# Patient Record
Sex: Male | Born: 1960 | Race: White | Hispanic: No | Marital: Married | Smoking: Former smoker
Health system: Southern US, Community
[De-identification: ages and names within clinical notes are randomized; demographics above are authoritative.]

## PROBLEM LIST (undated history)

## (undated) DIAGNOSIS — K469 Unspecified abdominal hernia without obstruction or gangrene: Secondary | ICD-10-CM

## (undated) DIAGNOSIS — N329 Bladder disorder, unspecified: Secondary | ICD-10-CM

## (undated) DIAGNOSIS — I10 Essential (primary) hypertension: Secondary | ICD-10-CM

## (undated) DIAGNOSIS — Z8719 Personal history of other diseases of the digestive system: Secondary | ICD-10-CM

## (undated) DIAGNOSIS — Z8669 Personal history of other diseases of the nervous system and sense organs: Secondary | ICD-10-CM

## (undated) HISTORY — PX: TONSILLECTOMY AND ADENOIDECTOMY: SUR1326

---

## 2014-04-28 DIAGNOSIS — Z8669 Personal history of other diseases of the nervous system and sense organs: Secondary | ICD-10-CM

## 2014-04-28 HISTORY — DX: Personal history of other diseases of the nervous system and sense organs: Z86.69

## 2017-04-15 ENCOUNTER — Other Ambulatory Visit: Payer: Self-pay | Admitting: Urology

## 2017-04-23 ENCOUNTER — Encounter (HOSPITAL_BASED_OUTPATIENT_CLINIC_OR_DEPARTMENT_OTHER): Payer: Self-pay | Admitting: *Deleted

## 2017-04-23 NOTE — Progress Notes (Signed)
NPO AFTER MN.  ARRIVE AT 0715.  NEEDS HG. 

## 2017-04-29 NOTE — H&P (Signed)
CC: I have blood in my urine.  HPI: Oscar Church is a 56 year-old male established patient who is here for blood in the urine.  He did see the blood in his urine. He first noticed the symptoms 03/22/2017. He has not seen blood clots.   He does not have a burning sensation when he urinates. He is not currently having trouble urinating.   He is not having pain.   Oscar Church returns today for cystoscopy to complete his hematuria w/u. He had a CT that showed prostate calcificaitons but no other abnormalities. He is for cystoscopy.   GU history: Oscar Church is a 56 yo WM who is sent from Gross hematuria that he noted on a first morning void on 5/25. He had just one episode and had no pain or dysuria. He had some light headedness and sweats on the way to work that day. He went to urgent care and had microhematuria. He has no history of stones. He may have had a UTI remotely. He has had no GU surgery. About 2 months ago he had a single episode of urgency. He has nocturia x 1. He is a smoker.     ALLERGIES: No Allergies    MEDICATIONS: No Medications    GU PSH: Locm 300-399Mg /Ml Iodine,1Ml - 03/28/2017    NON-GU PSH: No Non-GU PSH    GU PMH: Gross hematuria, He had single episode of gross hematuria with a negative CT. He will be set up to return for cystoscopy. - 03/28/2017    NON-GU PMH: Hypercholesterolemia    FAMILY HISTORY: 2 daughters - Other 1 son - Other nephrolithiasis - No Family History   SOCIAL HISTORY: Marital Status: Married Current Smoking Status: Patient smokes.   Tobacco Use Assessment Completed: Used Tobacco in last 30 days? Drinks 2 caffeinated drinks per day.    REVIEW OF SYSTEMS:    GU Review Male:   Patient denies frequent urination, hard to postpone urination, burning/ pain with urination, get up at night to urinate, leakage of urine, stream starts and stops, trouble starting your stream, have to strain to urinate , erection problems, and penile pain.   Gastrointestinal (Upper):   Patient denies nausea, vomiting, and indigestion/ heartburn.  Gastrointestinal (Lower):   Patient denies diarrhea and constipation.  Constitutional:   Patient denies fever, night sweats, weight loss, and fatigue.  Skin:   Patient denies skin rash/ lesion and itching.  Eyes:   Patient denies double vision and blurred vision.  Ears/ Nose/ Throat:   Patient denies sore throat and sinus problems.  Hematologic/Lymphatic:   Patient denies swollen glands and easy bruising.  Cardiovascular:   Patient denies leg swelling and chest pains.  Respiratory:   Patient denies cough and shortness of breath.  Endocrine:   Patient denies excessive thirst.  Musculoskeletal:   Patient denies back pain and joint pain.  Neurological:   Patient denies headaches and dizziness.  Psychologic:   Patient denies depression and anxiety.   VITAL SIGNS: None   PAST DATA REVIEWED:  Source Of History:  Patient   PROCEDURES:         Flexible Cystoscopy - 52000  Risks, benefits, and some of the potential complications of the procedure were discussed. 91ml of 2% lidocaine jelly was instilled intraurethrally. Cipro 500mg  given for antibiotic prophylaxis.     Meatus:  Normal size. Normal location. Normal condition.  Urethra:  No strictures.  External Sphincter:  Normal.  Verumontanum:  Normal.  Prostate:  Obstructing. Moderate  hyperplasia.  Bladder Neck:  Non-obstructing.  Ureteral Orifices:  Normal location. Normal size. Normal shape. Effluxed clear urine.  Bladder:  Mild trabeculation. Erythematous mucosa about 2cm above the right trigone and worrisome for CIS. No tumors. No stones.      The procedure was well tolerated and there were no complications.         Urinalysis w/Scope Dipstick Dipstick Cont'd Micro  Color: Yellow Bilirubin: Neg WBC/hpf: 0 - 5/hpf  Appearance: Clear Ketones: Neg RBC/hpf: NS (Not Seen)  Specific Gravity: 1.025 Blood: 1+ Bacteria: Rare (0-9/hpf)  pH: <=5.0  Protein: Neg Cystals: NS (Not Seen)  Glucose: Neg Urobilinogen: 0.2 Casts: NS (Not Seen)    Nitrites: Neg Trichomonas: Not Present    Leukocyte Esterase: Neg Mucous: Not Present      Epithelial Cells: 0 - 5/hpf      Yeast: NS (Not Seen)      Sperm: Not Present    ASSESSMENT:      ICD-10 Details  1 GU:   Gross hematuria - R31.0   2   Bladder tumor/neoplasm - D41.4 He has a bladder lesion on the right bladder base that is worrisome for CIS. I will get a cytology today and get him set up for a cystoscopy with biopsy and fulguration. Risks of bleeding, infection, bladder injury, thrombotic events and anesthetic complications reviewed.      PLAN:           Orders Labs Urine Cytology          Schedule Return Visit/Planned Activity: ASAP - Schedule Surgery

## 2017-04-30 ENCOUNTER — Encounter (HOSPITAL_BASED_OUTPATIENT_CLINIC_OR_DEPARTMENT_OTHER): Payer: Self-pay | Admitting: *Deleted

## 2017-04-30 ENCOUNTER — Ambulatory Visit (HOSPITAL_BASED_OUTPATIENT_CLINIC_OR_DEPARTMENT_OTHER)
Admission: RE | Admit: 2017-04-30 | Discharge: 2017-04-30 | Disposition: A | Discharge status: Home / Self Care | Payer: BLUE CROSS/BLUE SHIELD | Source: Ambulatory Visit | Attending: Urology | Admitting: Urology

## 2017-04-30 ENCOUNTER — Ambulatory Visit (HOSPITAL_BASED_OUTPATIENT_CLINIC_OR_DEPARTMENT_OTHER): Payer: BLUE CROSS/BLUE SHIELD | Admitting: Anesthesiology

## 2017-04-30 ENCOUNTER — Encounter (HOSPITAL_BASED_OUTPATIENT_CLINIC_OR_DEPARTMENT_OTHER)
Admission: RE | Disposition: A | Discharge status: Home / Self Care | Payer: Self-pay | Source: Ambulatory Visit | Attending: Urology

## 2017-04-30 DIAGNOSIS — Z87891 Personal history of nicotine dependence: Secondary | ICD-10-CM | POA: Insufficient documentation

## 2017-04-30 DIAGNOSIS — C672 Malignant neoplasm of lateral wall of bladder: Secondary | ICD-10-CM | POA: Diagnosis not present

## 2017-04-30 DIAGNOSIS — R310 Gross hematuria: Secondary | ICD-10-CM | POA: Diagnosis not present

## 2017-04-30 HISTORY — DX: Bladder disorder, unspecified: N32.9

## 2017-04-30 HISTORY — DX: Personal history of other diseases of the digestive system: Z87.19

## 2017-04-30 HISTORY — PX: CYSTOSCOPY WITH BIOPSY: SHX5122

## 2017-04-30 HISTORY — PX: CYSTOSCOPY WITH FULGERATION: SHX6638

## 2017-04-30 SURGERY — CYSTOSCOPY, WITH BLADDER FULGURATION
Anesthesia: General | Site: Bladder

## 2017-04-30 MED ORDER — LACTATED RINGERS IV SOLN
INTRAVENOUS | Status: DC
  Administered 2017-04-30 (×2): via INTRAVENOUS
  Filled 2017-04-30: qty 1000

## 2017-04-30 MED ORDER — DEXAMETHASONE SODIUM PHOSPHATE 10 MG/ML IJ SOLN
INTRAMUSCULAR | Status: AC
  Filled 2017-04-30: qty 1

## 2017-04-30 MED ORDER — PHENAZOPYRIDINE HCL 200 MG PO TABS: 200.0000 mg | ORAL_TABLET | Freq: Three times a day (TID) | ORAL | 0 refills | Status: DC | PRN

## 2017-04-30 MED ORDER — FENTANYL CITRATE (PF) 100 MCG/2ML IJ SOLN
INTRAMUSCULAR | Status: AC
  Filled 2017-04-30: qty 2

## 2017-04-30 MED ORDER — HYDROCODONE-ACETAMINOPHEN 5-325 MG PO TABS: 1.0000 | ORAL_TABLET | Freq: Four times a day (QID) | ORAL | 0 refills | Status: DC | PRN

## 2017-04-30 MED ORDER — PROPOFOL 10 MG/ML IV BOLUS
INTRAVENOUS | Status: AC
  Filled 2017-04-30: qty 40

## 2017-04-30 MED ORDER — SODIUM CHLORIDE 0.9% FLUSH
3.0000 mL | INTRAVENOUS | Status: DC | PRN
  Filled 2017-04-30: qty 3

## 2017-04-30 MED ORDER — STERILE WATER FOR IRRIGATION IR SOLN
Status: DC | PRN
  Administered 2017-04-30: 3000 mL

## 2017-04-30 MED ORDER — MORPHINE SULFATE (PF) 2 MG/ML IV SOLN
2.0000 mg | INTRAVENOUS | Status: DC | PRN
  Filled 2017-04-30: qty 1

## 2017-04-30 MED ORDER — KETOROLAC TROMETHAMINE 30 MG/ML IJ SOLN
INTRAMUSCULAR | Status: DC | PRN
  Administered 2017-04-30: 30 mg via INTRAVENOUS

## 2017-04-30 MED ORDER — ONDANSETRON HCL 4 MG/2ML IJ SOLN
INTRAMUSCULAR | Status: DC | PRN
  Administered 2017-04-30: 4 mg via INTRAVENOUS

## 2017-04-30 MED ORDER — LIDOCAINE 2% (20 MG/ML) 5 ML SYRINGE
INTRAMUSCULAR | Status: AC
  Filled 2017-04-30: qty 5

## 2017-04-30 MED ORDER — DEXAMETHASONE SODIUM PHOSPHATE 4 MG/ML IJ SOLN
INTRAMUSCULAR | Status: DC | PRN
  Administered 2017-04-30: 10 mg via INTRAVENOUS

## 2017-04-30 MED ORDER — PROPOFOL 10 MG/ML IV BOLUS
INTRAVENOUS | Status: DC | PRN
  Administered 2017-04-30 (×3): 50 mg via INTRAVENOUS
  Administered 2017-04-30: 200 mg via INTRAVENOUS

## 2017-04-30 MED ORDER — HYDROMORPHONE HCL 1 MG/ML IJ SOLN
0.2500 mg | INTRAMUSCULAR | Status: DC | PRN
  Filled 2017-04-30: qty 0.5

## 2017-04-30 MED ORDER — OXYCODONE HCL 5 MG PO TABS
5.0000 mg | ORAL_TABLET | Freq: Once | ORAL | Status: DC | PRN
  Filled 2017-04-30: qty 1

## 2017-04-30 MED ORDER — CEFAZOLIN SODIUM-DEXTROSE 2-4 GM/100ML-% IV SOLN
2.0000 g | INTRAVENOUS | Status: AC
  Administered 2017-04-30: 2 g via INTRAVENOUS
  Filled 2017-04-30: qty 100

## 2017-04-30 MED ORDER — KETOROLAC TROMETHAMINE 30 MG/ML IJ SOLN
INTRAMUSCULAR | Status: AC
  Filled 2017-04-30: qty 1

## 2017-04-30 MED ORDER — PROMETHAZINE HCL 25 MG/ML IJ SOLN
6.2500 mg | INTRAMUSCULAR | Status: DC | PRN
  Filled 2017-04-30: qty 1

## 2017-04-30 MED ORDER — FENTANYL CITRATE (PF) 100 MCG/2ML IJ SOLN
INTRAMUSCULAR | Status: DC | PRN
  Administered 2017-04-30 (×8): 25 ug via INTRAVENOUS

## 2017-04-30 MED ORDER — ACETAMINOPHEN 650 MG RE SUPP
650.0000 mg | RECTAL | Status: DC | PRN
  Filled 2017-04-30: qty 1

## 2017-04-30 MED ORDER — SODIUM CHLORIDE 0.9 % IR SOLN
Status: DC | PRN
  Administered 2017-04-30: 3000 mL via INTRAVESICAL

## 2017-04-30 MED ORDER — OXYCODONE HCL 5 MG PO TABS
5.0000 mg | ORAL_TABLET | ORAL | Status: DC | PRN
  Filled 2017-04-30: qty 2

## 2017-04-30 MED ORDER — SODIUM CHLORIDE 0.9% FLUSH
3.0000 mL | Freq: Two times a day (BID) | INTRAVENOUS | Status: DC
  Filled 2017-04-30: qty 3

## 2017-04-30 MED ORDER — SODIUM CHLORIDE 0.9 % IV SOLN
250.0000 mL | INTRAVENOUS | Status: DC | PRN
  Filled 2017-04-30: qty 250

## 2017-04-30 MED ORDER — ACETAMINOPHEN 325 MG PO TABS
650.0000 mg | ORAL_TABLET | ORAL | Status: DC | PRN
  Filled 2017-04-30: qty 2

## 2017-04-30 MED ORDER — OXYCODONE HCL 5 MG/5ML PO SOLN
5.0000 mg | Freq: Once | ORAL | Status: DC | PRN
  Filled 2017-04-30: qty 5

## 2017-04-30 MED ORDER — ONDANSETRON HCL 4 MG/2ML IJ SOLN
INTRAMUSCULAR | Status: AC
Start: 2017-04-30 — End: 2017-04-30
  Filled 2017-04-30: qty 2

## 2017-04-30 MED ORDER — CEFAZOLIN SODIUM-DEXTROSE 1-4 GM/50ML-% IV SOLN
INTRAVENOUS | Status: AC
  Filled 2017-04-30: qty 100

## 2017-04-30 MED ORDER — LIDOCAINE 2% (20 MG/ML) 5 ML SYRINGE
INTRAMUSCULAR | Status: DC | PRN
  Administered 2017-04-30: 100 mg via INTRAVENOUS

## 2017-04-30 MED ORDER — LIDOCAINE HCL 2 % EX GEL
CUTANEOUS | Status: DC | PRN
  Administered 2017-04-30: 1

## 2017-04-30 MED ORDER — MIDAZOLAM HCL 5 MG/5ML IJ SOLN
INTRAMUSCULAR | Status: DC | PRN
  Administered 2017-04-30: 2 mg via INTRAVENOUS

## 2017-04-30 MED ORDER — MIDAZOLAM HCL 2 MG/2ML IJ SOLN
INTRAMUSCULAR | Status: AC
  Filled 2017-04-30: qty 2

## 2017-04-30 SURGICAL SUPPLY — 22 items
BAG DRAIN URO-CYSTO SKYTR STRL (DRAIN) ×2 IMPLANT
CATH FOLEY 2WAY SLVR  5CC 16FR (CATHETERS)
CATH FOLEY 2WAY SLVR 5CC 16FR (CATHETERS) IMPLANT
CLOTH BEACON ORANGE TIMEOUT ST (SAFETY) ×2 IMPLANT
ELECT REM PT RETURN 9FT ADLT (ELECTROSURGICAL) ×2
ELECTRODE REM PT RTRN 9FT ADLT (ELECTROSURGICAL) ×1 IMPLANT
GLOVE SURG SS PI 8.0 STRL IVOR (GLOVE) ×2 IMPLANT
GOWN STRL REUS W/ TWL LRG LVL3 (GOWN DISPOSABLE) ×1 IMPLANT
GOWN STRL REUS W/ TWL XL LVL3 (GOWN DISPOSABLE) ×1 IMPLANT
GOWN STRL REUS W/TWL LRG LVL3 (GOWN DISPOSABLE) ×1
GOWN STRL REUS W/TWL XL LVL3 (GOWN DISPOSABLE) ×1
KIT RM TURNOVER CYSTO AR (KITS) ×2 IMPLANT
LOOP CUT BIPOLAR 24F LRG (ELECTROSURGICAL) ×2 IMPLANT
MANIFOLD NEPTUNE II (INSTRUMENTS) IMPLANT
NDL SAFETY ECLIPSE 18X1.5 (NEEDLE) IMPLANT
NEEDLE HYPO 18GX1.5 SHARP (NEEDLE)
NEEDLE HYPO 22GX1.5 SAFETY (NEEDLE) IMPLANT
NS IRRIG 500ML POUR BTL (IV SOLUTION) IMPLANT
PACK CYSTO (CUSTOM PROCEDURE TRAY) ×2 IMPLANT
SYR 20CC LL (SYRINGE) IMPLANT
TUBE CONNECTING 12X1/4 (SUCTIONS) IMPLANT
WATER STERILE IRR 3000ML UROMA (IV SOLUTION) ×2 IMPLANT

## 2017-04-30 NOTE — Interval H&P Note (Signed)
History and Physical Interval Note:  04/30/2017 8:12 AM  Oscar Church  has presented today for surgery, with the diagnosis of BLADDER LESION  The various methods of treatment have been discussed with the patient and family. After consideration of risks, benefits and other options for treatment, the patient has consented to  Procedure(s): CYSTOSCOPY WITH FULGERATION (N/A) CYSTOSCOPY WITH BIOPSY (N/A) as a surgical intervention .  The patient's history has been reviewed, patient examined, no change in status, stable for surgery.  I have reviewed the patient's chart and labs.  Questions were answered to the patient's satisfaction.     [name]

## 2017-04-30 NOTE — Discharge Instructions (Addendum)
Bladder Cancer Bladder cancer is an abnormal growth of tissue in the bladder. The bladder is the balloon-like sac in the pelvis. It collects and stores urine that comes from the kidneys through the ureters. The bladder wall is made of layers. If cancer spreads into these layers and through the wall of the bladder, it becomes more difficult to treat. What are the causes? The cause of this condition is not known. What increases the risk? The following factors may make you more likely to develop this condition:  Smoking.  Workplace risks (occupational exposures), such as rubber, leather, textile, dyes, chemicals, and paint.  Being white.  Your age. Most people with bladder cancer are over the age of 14.  Being male.  Having chronic bladder inflammation.  Having a personal history of bladder cancer.  Having a family history of bladder cancer (heredity).  Having had chemotherapy or radiation therapy to the pelvis.  Having been exposed to arsenic.  What are the signs or symptoms? Initial symptoms of this condition include:  Blood in the urine.  Painful urination.  Frequent bladder or urine infections.  Increase in urgency and frequency of urination.  Advanced symptoms of this condition include:  Not being able to urinate.  Low back pain on one side.  Loss of appetite.  Weight loss.  Fatigue.  Swelling in the feet.  Bone pain.  How is this diagnosed? This condition is diagnosed based on your medical history, a physical exam, urine tests, lab tests, imaging tests, and your symptoms. You may also have other tests or procedures done, such as:  A narrow tube being inserted into your bladder through your urethra (cystoscopy) in order to view the lining of your bladder for tumors.  A biopsy to sample the tumor to see if cancer is present.  If cancer is present, it will then be staged to determine its severity and extent. Staging is an assessment of:  The size of the  tumor.  Whether the cancer has spread.  Where the cancer has spread.  It is important to know how deeply into the bladder wall cancer has grown and whether cancer has spread to any other parts of your body. Staging may require blood tests or imaging tests, such as a CT scan, MRI, bone scan, or chest X-ray. How is this treated? Based on the stage of cancer, one treatment or a combination of treatments may be recommended. The most common forms of treatment are:  Surgery to remove the cancer. Procedures that may be done include transurethral resection and cystectomy.  Radiation therapy. This is high-energy X-rays or other particles. This is often used in combination with chemotherapy.  Chemotherapy. During this treatment, medicines are used to kill cancer cells.  Immunotherapy. This uses medicines to help your own immune system destroy cancer cells.  Follow these instructions at home:  Take over-the-counter and prescription medicines only as told by your health care provider.  Maintain a healthy diet. Some of your treatments might affect your appetite.  Consider joining a support group. This may help you learn to cope with the stress of having bladder cancer.  Tell your cancer care team if you develop side effects. They may be able to recommend ways to relieve them.  Keep all follow-up visits as told by your health care provider. This is important. Where to find more information:  American Cancer Society: www.cancer.[place]): www.cancer.gov Contact a health care provider if:  You have symptoms of a urinary  tract infection. These include: ? Fever. ? Chills. ? Weakness. ? Muscle aches. ? Abdominal pain. ? Frequent and intense urge to urinate. ? Burning feeling in the bladder or urethra during urination. Get help right away if:  There is blood in your urine.  You cannot urinate.  You have severe pain or other symptoms that do not go  away. Summary  Bladder cancer is an abnormal growth of tissue in the bladder.  This condition is diagnosed based on your medical history, a physical exam, urine tests, lab tests, imaging tests, and your symptoms.  Based on the stage of cancer, surgery, chemotherapy, or a combination of treatments may be recommended.  Consider joining a support group. This may help you learn to cope with the stress of having bladder cancer. This information is not intended to replace advice given to you by your health care provider. Make sure you discuss any questions you have with your health care provider. Document Released: 10/18/2003 Document Revised: 09/18/2016 Document Reviewed: 09/18/2016 Elsevier Interactive Patient Education  2017 [place] Calmette-Guerin Live, BCG intravesical solution What is this medicine? BACILLUS CALMETTE-GUERIN LIVE, BCG (ba SIL [place] KAL met gay RAYN) is a bacteria solution. This medicine stimulates the immune system to ward off cancer cells. It is used to treat bladder cancer. This medicine may be used for other purposes; ask your health care provider or pharmacist if you have questions. COMMON BRAND NAME(S): Theracys, TICE BCG What should I tell my health care provider before I take this medicine? They need to know if you have any of these conditions: -aneurysm -blood in the urine -bladder biopsy within 2 weeks -fever or infection -immune system problems -leukemia -lymphoma -myasthenia gravis -need organ transplant -prosthetic device like arterial graft, artificial joint, prosthetic heart valve -recent or ongoing radiation therapy -tuberculosis -an unusual or allergic reaction to Bacillus Calmette-Guerin Live, BCG, latex, other medicines, foods, dyes, or preservatives -pregnant or trying to get pregnant -breast-feeding How should I use this medicine? This drug is given as a catheter infusion into the bladder. It is administered in a hospital or  clinic by a specially trained health care professional. Dennis Bast will be given directions to follow before the treatment. Follow your doctor's directions carefully. Try to hold this medicine in your bladder for 2 hours after treatment. Talk to your pediatrician regarding the use of this medicine in children. Special care may be needed. Overdosage: If you think you have taken too much of this medicine contact a poison control center or emergency room at once. NOTE: This medicine is only for you. Do not share this medicine with others. What if I miss a dose? It is important not to miss your dose. Call your doctor or health care professional if you are unable to keep an appointment. What may interact with this medicine? -antibiotics -medicines to suppress your immune system like chemotherapy agents or corticosteroids -medicine to treat tuberculosis This list may not describe all possible interactions. Give your health care provider a list of all the medicines, herbs, non-prescription drugs, or dietary supplements you use. Also tell them if you smoke, drink alcohol, or use illegal drugs. Some items may interact with your medicine. What should I watch for while using this medicine? Visit your doctor for checks on your progress. This drug may make you feel generally unwell. Contact your doctor if your symptoms last more than 2 days or if they get worse. Call your doctor right away if you have a severe or  unusual symptom. Infection can be spread to others through contact with this medicine. To prevent the spread of infection follow your doctor's directions carefully after treatment. For the first 6 hours after each treatment, sit down on the toilet to urinate. After urinating, add 2 cups of bleach to the toilet bowl and let set for 15 minutes before flushing. Wash your hands before and after using the restroom. Drink water or other fluids as directed after treatment with this medicine. Do not become pregnant  while taking this medicine. Women should inform their doctor if they wish to become pregnant or think they might be pregnant. There is a potential for serious side effects to an unborn child. Talk to your health care professional or pharmacist for more information. Do not breast-feed an infant while taking this medicine. What side effects may I notice from receiving this medicine? Side effects that you should report to your doctor or health care professional as soon as possible: -allergic reactions like skin rash, itching or hives, swelling of the face, lips, or tongue -signs of infection - fever or chills, cough, sore throat, pain or difficulty passing urine -signs of decreased red blood cells - unusually weak or tired, fainting spells, lightheadedness -blood in urine -breathing problems -cough -eye pain, redness -flu-like symptoms -joint pain -bladder-area pain for more than 2 days after treatment -trouble passing urine or change in the amount of urine -vomiting -yellowing of the eyes or skin Side effects that usually do not require medical attention (report to your doctor or health care professional if they continue or are bothersome): -bladder spasm -burning when passing urine within 2 days of treatment -feel need to pass urine often or wake up at night to pass urine -loss of appetite This list may not describe all possible side effects. Call your doctor for medical advice about side effects. You may report side effects to FDA at 1-800-FDA-1088. Where should I keep my medicine? This drug is given in a hospital or clinic and will not be stored at home. NOTE: This sheet is a summary. It may not cover all possible information. If you have questions about this medicine, talk to your doctor, pharmacist, or health care provider.  2018 Elsevier/Gold Standard (2015-11-17 10:33:35)   Transurethral Resection of Bladder Tumor, Care After Refer to this sheet in the next few weeks. These  instructions provide you with information about caring for yourself after your procedure. Your health care provider may also give you more specific instructions. Your treatment has been planned according to current medical practices, but problems sometimes occur. Call your health care provider if you have any problems or questions after your procedure. What can I expect after the procedure? After the procedure, it is common to have:  A small amount of blood in your urine for up to 2 weeks.  Soreness or mild discomfort from your catheter. After your catheter is removed, you may have mild soreness, especially when urinating.  Pain in your lower abdomen.  Follow these instructions at home:  Medicines  Take over-the-counter and prescription medicines only as told by your health care provider.  Do not drive or operate heavy machinery while taking prescription pain medicine.  Do not drive for 24 hours if you received a sedative.  If you were prescribed antibiotic medicine, take it as told by your health care provider. Do not stop taking the antibiotic even if you start to feel better. Activity  Return to your normal activities as told by your health  care provider. Ask your health care provider what activities are safe for you.  Do not lift anything that is heavier than 10 lb (4.5 kg) for as long as told by your health care provider.  Avoid intense physical activity for as long as told by your health care provider.  Walk at least one time every day. This helps to prevent blood clots. You may increase your physical activity gradually as you start to feel better. General instructions  Do not drink alcohol for as long as told by your health care provider. This is especially important if you are taking prescription pain medicines.  Do not take baths, swim, or use a hot tub until your health care provider approves.  If you have a catheter, follow instructions from your health care provider  about caring for your catheter and your drainage bag.  Drink enough fluid to keep your urine clear or pale yellow.  Wear compression stockings as told by your health care provider. These stockings help to prevent blood clots and reduce swelling in your legs.  Keep all follow-up visits as told by your health care provider. This is important. Contact a health care provider if:  You have pain that gets worse or does not improve with medicine.  You have blood in your urine for more than 2 weeks.  You have cloudy or bad-smelling urine.  You become constipated. Signs of constipation may include having: ? Fewer than three bowel movements in a week. ? Difficulty having a bowel movement. ? Stools that are dry, hard, or larger than normal.  You have a fever. Get help right away if:  You have: ? Severe pain. ? Bright-red blood in your urine. ? Blood clots in your urine. ? A lot of blood in your urine.  Your catheter has been removed and you are not able to urinate.  You have a catheter in place and the catheter is not draining urine. This information is not intended to replace advice given to you by your health care provider. Make sure you discuss any questions you have with your health care provider. Document Released: 09/26/2015 Document Revised: 06/17/2016 Document Reviewed: 07/07/2015 Elsevier Interactive Patient Education  2018 [place] [facility] Home Care Instructions  Activity: Get plenty of rest for the remainder of the day. A responsible individual must stay with you for 24 hours following the procedure.  For the next 24 hours, DO NOT: -Drive a car -Paediatric nurse -Drink alcoholic beverages -Take any medication unless instructed by your physician -Make any legal decisions or sign important papers.  Meals: Start with liquid foods such as gelatin or soup. Progress to regular foods as tolerated. Avoid greasy, spicy, heavy foods. If nausea and/or vomiting  occur, drink only clear liquids until the nausea and/or vomiting subsides. Call your physician if vomiting continues.  Special Instructions/Symptoms: Your throat may feel dry or sore from the anesthesia or the breathing tube placed in your throat during surgery. If this causes discomfort, gargle with warm salt water. The discomfort should disappear within 24 hours.  If you had a scopolamine patch placed behind your ear for the management of post- operative nausea and/or vomiting:  1. The medication in the patch is effective for 72 hours, after which it should be removed.  Wrap patch in a tissue and discard in the trash. Wash hands thoroughly with soap and water. 2. You may remove the patch earlier than 72 hours if you experience unpleasant side effects which  may include dry mouth, dizziness or visual disturbances. 3. Avoid touching the patch. Wash your hands with soap and water after contact with the patch.

## 2017-04-30 NOTE — Anesthesia Postprocedure Evaluation (Signed)
Anesthesia Post Note  Patient: Oscar Church  Procedure(s) Performed: Procedure(s) (LRB): CYSTOSCOPY WITH FULGERATION (N/A) CYSTOSCOPY WITH BIOPSY (N/A)     Patient location during evaluation: PACU Anesthesia Type: General Level of consciousness: awake and alert Pain management: pain level controlled Vital Signs Assessment: post-procedure vital signs reviewed and stable Respiratory status: spontaneous breathing, nonlabored ventilation and respiratory function stable Cardiovascular status: blood pressure returned to baseline and stable Postop Assessment: no signs of nausea or vomiting Anesthetic complications: no    Last Vitals:  Vitals:   04/30/17 1000 04/30/17 1015  BP: 116/77 115/67  Pulse: (!) 51 (!) 55  Resp: 10 10  Temp:      Last Pain:  Vitals:   04/30/17 0925  TempSrc:   PainSc: Asleep                 Lynda Rainwater

## 2017-04-30 NOTE — Brief Op Note (Signed)
04/30/2017  9:09 AM  PATIENT:  Oscar Church  56 y.o. 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  PROCEDURE:  Procedure(s): CYSTOSCOPY WITH TURBT (N/A) medium 2-5cm tumor posterior wall CYSTOSCOPY WITH BIOPSY and FULGURATION (N/A) trigone, left lateral wall and left bladder neck  SURGEON:  Surgeon(s) and Role:    Irine Seal, MD - Primary  PHYSICIAN ASSISTANT:   ASSISTANTS: none   ANESTHESIA:   general  EBL:  Total I/O In: 1000 [I.V.:1000] Out: 0   BLOOD ADMINISTERED:none  DRAINS: none   LOCAL MEDICATIONS USED:  41ml 2% lidocaine jelly  SPECIMEN:  Source of Specimen:  Posterior wall, trigone, left lateral wall and left bladder neck TUR and cup biopsies  DISPOSITION OF SPECIMEN:  PATHOLOGY  COUNTS:  YES  TOURNIQUET:  * No tourniquets in log *  DICTATION: .Other Dictation: Dictation Number [id number]  PLAN OF CARE: Discharge to home after PACU  PATIENT DISPOSITION:  PACU - hemodynamically stable.   Delay start of Pharmacological VTE agent (>24hrs) due to surgical blood loss or risk of bleeding: yes

## 2017-04-30 NOTE — Op Note (Signed)
Oscar Church, Oscar Church              ACCOUNT NO.:  [id number]  MEDICAL RECORD NO.:  [id number]  LOCATION:                                 FACILITY:  PHYSICIAN:  Marshall Cork. Jeffie Pollock, M.D.         DATE OF BIRTH:  DATE OF PROCEDURE:  04/30/2017 DATE OF DISCHARGE:                              OPERATIVE REPORT   PROCEDURE: 1. Cystoscopy with transurethral resection of medium bladder tumor,     posterior wall. 2. Cystoscopy with biopsy and fulguration of tumors of the trigone,     left lateral wall and left anterior bladder neck.  PREOPERATIVE DIAGNOSIS:  Bladder lesion.  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  SURGEON:  Marshall Cork. Jeffie Pollock, M.D.  ANESTHESIA:  General.  SPECIMEN:  TUR chips from the posterior wall and trigone.  Cup biopsy specimens from posterior wall, left lateral wall and left anterior bladder neck.  ESTIMATED BLOOD LOSS:  Minimal.  DRAINS:  None.  COMPLICATIONS:  None.  INDICATIONS:  Mr. Oscar Church is a 56 year old white male who presented for evaluation of hematuria, was found on cystoscopy to have an erythematous lesion on the posterior wall suggestive of carcinoma in situ.  It was felt that biopsy and fulguration were indicated.  DESCRIPTION OF PROCEDURE:  Findings and Procedure:  He was taken to the operating room where general anesthetic was induced.  He was given Ancef.  He was placed in lithotomy position and was fitted with PAS hose.  His perineum and genitalia were prepped with Betadine solution. He was draped in usual sterile fashion.  Cystoscopy was performed using a 23-French scope and 30 and 70-degree lenses.  Examination revealed a normal urethra.  The external sphincter was intact.  The prostatic urethra was short with trilobar hyperplasia with minimal obstruction.  Examination of bladder revealed mild trabeculation.  The ureteral orifices were in the normal anatomic position effluxing  clear urine.  The left was slightly patulous, but in normal position.  He had several abnormal areas on the bladder wall.  There was an area approximately 4 cm in size on the right posterior wall that was suspicious for carcinoma in situ.  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  There was another area of erythema approximately 1 x 2 cm on the left anterior bladder neck worrisome for carcinoma in situ, and there was some cobblestoning of the mucosa in the trigone, also concerning for carcinoma in situ.  Initially, cup biopsies were obtained from the posterior wall, left lateral wall, left anterior bladder neck regions.  I then inserted a 28-French continuous flow resectoscope sheath after urethral dilation to 32-French due to tight meatus.  The scope was passed to the bladder with the aid of the visual obturator.  Then fitted with Beatrix Fetters handle with a bipolar loop and the 30-degree lens.  Saline was used as an irrigant.  Additional TUR biopsies were obtained from the right posterior lateral wall and also from the trigone.  All of the abnormal mucosal areas were then generously fulgurated to destroy the abnormal mucosa and to provide hemostasis,  remained well away from the ureteral orifices.  Once the fulguration was complete and hemostasis was achieved and all the specimens had been retrieved, final inspection revealed no active bleeding and no retained tissue.  The bladder was then partially drained.  The resectoscope was removed. The urethra was instilled with 10 mL of 2% lidocaine jelly and a gauze wrap was placed, retained the lidocaine.  The patient was then taken down from the lithotomy position.  His anesthetic was reversed.  He was moved to recovery room in stable condition.  There were no complications.     Marshall Cork. Jeffie Pollock, M.D.   ______________________________ Marshall Cork. Jeffie Pollock,  M.D.    JJW/MEDQ  D:  04/30/2017  T:  04/30/2017  Job:  [id number]

## 2017-04-30 NOTE — Anesthesia Procedure Notes (Signed)
Procedure Name: LMA Insertion Date/Time: 04/30/2017 8:36 AM Performed by: Justice Rocher Pre-anesthesia Checklist: Patient identified, Emergency Drugs available, Suction available and Patient being monitored Patient Re-evaluated:Patient Re-evaluated prior to inductionOxygen Delivery Method: Circle system utilized Preoxygenation: Pre-oxygenation with 100% oxygen Intubation Type: IV induction Ventilation: Mask ventilation without difficulty LMA: LMA inserted LMA Size: 4.0 Number of attempts: 1 Airway Equipment and Method: Bite block Placement Confirmation: positive ETCO2 and breath sounds checked- equal and bilateral Tube secured with: Tape Dental Injury: Teeth and Oropharynx as per pre-operative assessment

## 2017-04-30 NOTE — Transfer of Care (Signed)
Immediate Anesthesia Transfer of Care Note  Patient: Oscar Church  Procedure(s) Performed: Procedure(s) (LRB): CYSTOSCOPY WITH FULGERATION (N/A) CYSTOSCOPY WITH BIOPSY (N/A)  Patient Location: PACU  Anesthesia Type: General  Level of Consciousness: awake, sedated, patient cooperative and responds to stimulation  Airway & Oxygen Therapy: Patient Spontanous Breathing and Patient connected to [place] O2  Post-op Assessment: Report given to PACU RN, Post -op Vital signs reviewed and stable and Patient moving all extremities  Post vital signs: Reviewed and stable  Complications: No apparent anesthesia complications

## 2017-04-30 NOTE — Anesthesia Preprocedure Evaluation (Signed)
Anesthesia Evaluation  Patient identified by MRN, date of birth, ID band Patient awake    Reviewed: Allergy & Precautions, NPO status , Patient's Chart, lab work & pertinent test results  Airway Mallampati: II  TM Distance: >3 FB Neck ROM: Full    Dental no notable dental hx.    Pulmonary neg pulmonary ROS, former smoker,    Pulmonary exam normal breath sounds clear to auscultation       Cardiovascular negative cardio ROS Normal cardiovascular exam Rhythm:Regular Rate:Normal     Neuro/Psych negative neurological ROS  negative psych ROS   GI/Hepatic negative GI ROS, Neg liver ROS,   Endo/Other  negative endocrine ROS  Renal/GU negative Renal ROS   Bladder lesion, hematuria    Musculoskeletal negative musculoskeletal ROS (+)   Abdominal   Peds negative pediatric ROS (+)  Hematology negative hematology ROS (+)   Anesthesia Other Findings   Reproductive/Obstetrics negative OB ROS                             Anesthesia Physical Anesthesia Plan  ASA: II  Anesthesia Plan: General   Post-op Pain Management:    Induction: Intravenous  PONV Risk Score and Plan: 3 and Ondansetron, Propofol, Midazolam and Treatment may vary due to age or medical condition  Airway Management Planned: LMA  Additional Equipment:   Intra-op Plan:   Post-operative Plan: Extubation in OR  Informed Consent: I have reviewed the patients History and Physical, chart, labs and discussed the procedure including the risks, benefits and alternatives for the proposed anesthesia with the patient or authorized representative who has indicated his/her understanding and acceptance.   Dental advisory given  Plan Discussed with: CRNA  Anesthesia Plan Comments:         Anesthesia Quick Evaluation

## 2017-05-02 ENCOUNTER — Encounter (HOSPITAL_BASED_OUTPATIENT_CLINIC_OR_DEPARTMENT_OTHER): Payer: Self-pay | Admitting: Urology

## 2017-05-02 LAB — POCT HEMOGLOBIN-HEMACUE: HEMOGLOBIN: 16 g/dL (ref 13.0–17.0)

## 2017-11-18 ENCOUNTER — Other Ambulatory Visit: Payer: Self-pay | Admitting: Urology

## 2017-11-20 HISTORY — PX: ROOT CANAL: SHX2363

## 2017-11-21 ENCOUNTER — Other Ambulatory Visit: Payer: Self-pay

## 2017-11-21 ENCOUNTER — Encounter (HOSPITAL_BASED_OUTPATIENT_CLINIC_OR_DEPARTMENT_OTHER): Payer: Self-pay | Admitting: *Deleted

## 2017-11-21 NOTE — Progress Notes (Signed)
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

## 2017-11-27 NOTE — H&P (Signed)
CC: I have bladder cancer that has been treated.  HPI: Oscar Church is a 57 year-old male established patient who is here for follow-up of bladder cancer treatment.  His bladder cancer was superficial and limitied to the bladder lining. His bladder cancer was not muscle invasive.   He did have a TURBT. His last bladder tumor was resected 04/30/2017.   He has not had blood in his urine recently.   Oscar Church returns today in f/u for bladder cancer surveillance. He had a TURBT on 04/30/17. He had several abnormal lesions in the bladder with biopsies from the posterior wall and bladder neck showing atypia and a lesion from the LL wall being LG NMIBC. No CIS or HG disease was noted. A cytology on 07/25/17 was negative. He continues to have some urgency but no UUI and initial dysuria. He has nocturia x 2 which is stable. He has increased frequency during the day as well. He has had no hematuria. He has completed the oxybutynin and doesn't think he needs to resume it.      CC: My PSA is elevated above the normal range.  HPI: His PSA is 3.51. He has had PSA's drawn prior to this one. He has had elevated PSA's prior to this one.   His PSA has been falling since last summer.      ALLERGIES: None   MEDICATIONS: Oxybutynin Chloride Er 5 mg tablet, extended release 24 hr 1 tablet PO Daily  Fenofibrate  Vitamin C     GU PSH: Cystoscopy - 08/07/2017, 04/11/2017 Cystoscopy Fulguration - 04/30/2017 Cystoscopy TURBT 2-5 cm - 04/30/2017 Locm 300-399Mg /Ml Iodine,1Ml - 03/28/2017    NON-GU PSH: None   GU PMH: Elevated PSA, He reports that Dr. Nancy Fetter told him his PSA is up. I have requested that result and will repeat the PSA with a week of abstinence in 3 months. The elevation is probably from his recent instrumentation. - 08/07/2017 History of bladder cancer, He has persistent post op edema and inflammation particularly on the left with associated urgency and dysuria. I am going to given him Uribel and will repeat  cystoscopy in 3 months. If the lesion persists at that time, he will need reresection. - 08/07/2017 Urinary Urgency, I am going to give him Oxybutynin ER 5mg  daily. I have reviewed the side effects. - 08/07/2017 Bladder Cancer overlapping sites, He had LG NMIBC with patchy areas of atypia vs reactive disease. I am going to have him return in 3 months for cystoscopy with a cytology prior to the visit. I don't see a need for BCG at this time. - 05/09/2017 Bladder tumor/neoplasm, He has a bladder lesion on the right bladder base that is worrisome for CIS. I will get a cytology today and get him set up for a cystoscopy with biopsy and fulguration. Risks of bleeding, infection, bladder injury, thrombotic events and anesthetic complications reviewed. - 04/11/2017 Gross hematuria - 04/11/2017, He had single episode of gross hematuria with a negative CT. He will be set up to return for cystoscopy., - 03/28/2017    NON-GU PMH: Hypercholesterolemia    FAMILY HISTORY: 2 daughters - Other 1 son - Other nephrolithiasis - No Family History   SOCIAL HISTORY: Marital Status: Married Preferred Language: English; Race: White Current Smoking Status: Patient smokes.   Tobacco Use Assessment Completed: Used Tobacco in last 30 days? Drinks 2 caffeinated drinks per day.    REVIEW OF SYSTEMS:    GU Review Male:   Patient reports get up  at night to urinate. Patient denies frequent urination, hard to postpone urination, burning/ pain with urination, leakage of urine, stream starts and stops, trouble starting your stream, have to strain to urinate , erection problems, and penile pain.  Gastrointestinal (Upper):   Patient denies nausea, vomiting, and indigestion/ heartburn.  Gastrointestinal (Lower):   Patient denies diarrhea and constipation.  Constitutional:   Patient denies fever, night sweats, weight loss, and fatigue.  Skin:   Patient denies skin rash/ lesion and itching.  Eyes:   Patient denies blurred vision and  double vision.  Ears/ Nose/ Throat:   Patient denies sore throat and sinus problems.  Hematologic/Lymphatic:   Patient denies swollen glands and easy bruising.  Cardiovascular:   Patient denies chest pains and leg swelling.  Respiratory:   Patient denies cough and shortness of breath.  Endocrine:   Patient denies excessive thirst.  Musculoskeletal:   Patient denies back pain and joint pain.  Neurological:   Patient denies headaches and dizziness.  Psychologic:   Patient denies depression and anxiety.   VITAL SIGNS:      11/13/2017 10:12 AM  Weight 210 lb / 95.25 kg  Height 67.5 in / 171.45 cm  BP 147/89 mmHg  Pulse 71 /min  Temperature 97.8 F / 36.5 C  BMI 32.4 kg/m   MULTI-SYSTEM PHYSICAL EXAMINATION:    Constitutional: Well-nourished. No physical deformities. Normally developed. Good grooming.  Respiratory: No labored breathing, no use of accessory muscles. CTA  Cardiovascular: Normal temperature, RRR without murmur     PAST DATA REVIEWED:  Source Of History:  Patient  Lab Test Review:   PSA  Urine Test Review:   Urinalysis   11/05/17  PSA  Total PSA 3.51 ng/mL    PROCEDURES:         Flexible Cystoscopy - 52000  Risks, benefits, and some of the potential complications of the procedure were discussed. 7ml of 2% lidocaine jelly was instilled intraurethrally.     Meatus:  Normal size. Normal location. Normal condition.  Urethra:  No strictures.  External Sphincter:  Normal.  Verumontanum:  Normal.  Prostate:  Obstructing. Enlarged median lobe. Moderate hyperplasia.  Bladder Neck:  Non-obstructing.  Ureteral Orifices:  Normal location. Normal size. Normal shape. Effluxed clear urine.  Bladder:  Moderate trabeculation. There are possible recurrent tumors vs CIS in the left trigone surrounding the prior biopsy site in area about 2cm in diameter. No stones.      The procedure was well tolerated and there were no complications.         Urinalysis Dipstick Dipstick  Cont'd  Color: Yellow Bilirubin: Neg  Appearance: Clear Ketones: Neg  Specific Gravity: 1.020 Blood: Neg  pH: <=5.0 Protein: Neg  Glucose: Neg Urobilinogen: 0.2    Nitrites: Neg    Leukocyte Esterase: Neg    ASSESSMENT:      ICD-10 Details  1 GU:   Elevated PSA - R97.20 His PSA has been falling over the last several months so I will continue to watch it with a repeat in 6 months.   2   Bladder Cancer overlapping sites - C67.8 Worsening - He has a possible recurrence on the left trigone and BN. Urine cytology today. I will set him up for a TURBT. I have reviewed the risks including bleeding, infection, ureteral and bladder injury, need for secondary procedures, thrombotic events and anesthetic complications.    PLAN:           Orders Labs Urine Cytology  Schedule Return Visit/Planned Activity: Next Available Appointment - Schedule Surgery

## 2017-11-28 ENCOUNTER — Other Ambulatory Visit: Payer: Self-pay

## 2017-11-28 ENCOUNTER — Ambulatory Visit (HOSPITAL_BASED_OUTPATIENT_CLINIC_OR_DEPARTMENT_OTHER): Payer: BLUE CROSS/BLUE SHIELD | Admitting: Anesthesiology

## 2017-11-28 ENCOUNTER — Encounter (HOSPITAL_BASED_OUTPATIENT_CLINIC_OR_DEPARTMENT_OTHER): Payer: Self-pay | Admitting: *Deleted

## 2017-11-28 ENCOUNTER — Ambulatory Visit (HOSPITAL_BASED_OUTPATIENT_CLINIC_OR_DEPARTMENT_OTHER)
Admission: RE | Admit: 2017-11-28 | Discharge: 2017-11-28 | Disposition: A | Discharge status: Home / Self Care | Payer: BLUE CROSS/BLUE SHIELD | Source: Ambulatory Visit | Attending: Urology | Admitting: Urology

## 2017-11-28 ENCOUNTER — Encounter (HOSPITAL_BASED_OUTPATIENT_CLINIC_OR_DEPARTMENT_OTHER)
Admission: RE | Disposition: A | Discharge status: Home / Self Care | Payer: Self-pay | Source: Ambulatory Visit | Attending: Urology

## 2017-11-28 DIAGNOSIS — D414 Neoplasm of uncertain behavior of bladder: Secondary | ICD-10-CM | POA: Diagnosis not present

## 2017-11-28 DIAGNOSIS — N3020 Other chronic cystitis without hematuria: Secondary | ICD-10-CM | POA: Insufficient documentation

## 2017-11-28 DIAGNOSIS — Z8551 Personal history of malignant neoplasm of bladder: Secondary | ICD-10-CM | POA: Diagnosis not present

## 2017-11-28 DIAGNOSIS — N3289 Other specified disorders of bladder: Secondary | ICD-10-CM | POA: Diagnosis not present

## 2017-11-28 DIAGNOSIS — D494 Neoplasm of unspecified behavior of bladder: Secondary | ICD-10-CM | POA: Diagnosis present

## 2017-11-28 DIAGNOSIS — Z08 Encounter for follow-up examination after completed treatment for malignant neoplasm: Secondary | ICD-10-CM | POA: Diagnosis not present

## 2017-11-28 DIAGNOSIS — F17200 Nicotine dependence, unspecified, uncomplicated: Secondary | ICD-10-CM | POA: Diagnosis not present

## 2017-11-28 HISTORY — PX: TRANSURETHRAL RESECTION OF BLADDER TUMOR: SHX2575

## 2017-11-28 LAB — POCT HEMOGLOBIN-HEMACUE: HEMOGLOBIN: 15.1 g/dL (ref 13.0–17.0)

## 2017-11-28 SURGERY — TURBT (TRANSURETHRAL RESECTION OF BLADDER TUMOR)
Anesthesia: General

## 2017-11-28 MED ORDER — FENTANYL CITRATE (PF) 100 MCG/2ML IJ SOLN
INTRAMUSCULAR | Status: DC | PRN
  Administered 2017-11-28 (×2): 50 ug via INTRAVENOUS

## 2017-11-28 MED ORDER — SODIUM CHLORIDE 0.9 % IV SOLN
250.0000 mL | INTRAVENOUS | Status: DC | PRN
  Filled 2017-11-28: qty 250

## 2017-11-28 MED ORDER — FENTANYL CITRATE (PF) 100 MCG/2ML IJ SOLN
INTRAMUSCULAR | Status: AC
  Filled 2017-11-28: qty 2

## 2017-11-28 MED ORDER — CEFAZOLIN SODIUM-DEXTROSE 2-4 GM/100ML-% IV SOLN
2.0000 g | INTRAVENOUS | Status: AC
  Administered 2017-11-28: 2 g via INTRAVENOUS
  Filled 2017-11-28: qty 100

## 2017-11-28 MED ORDER — SODIUM CHLORIDE 0.9% FLUSH
3.0000 mL | INTRAVENOUS | Status: DC | PRN
  Filled 2017-11-28: qty 3

## 2017-11-28 MED ORDER — MORPHINE SULFATE (PF) 2 MG/ML IV SOLN
2.0000 mg | INTRAVENOUS | Status: DC | PRN
  Filled 2017-11-28: qty 1

## 2017-11-28 MED ORDER — ONDANSETRON HCL 4 MG/2ML IJ SOLN
INTRAMUSCULAR | Status: DC | PRN
  Administered 2017-11-28: 4 mg via INTRAVENOUS

## 2017-11-28 MED ORDER — PROMETHAZINE HCL 25 MG/ML IJ SOLN
6.2500 mg | INTRAMUSCULAR | Status: DC | PRN
  Filled 2017-11-28: qty 1

## 2017-11-28 MED ORDER — DEXAMETHASONE SODIUM PHOSPHATE 10 MG/ML IJ SOLN
INTRAMUSCULAR | Status: AC
  Filled 2017-11-28: qty 1

## 2017-11-28 MED ORDER — ONDANSETRON HCL 4 MG/2ML IJ SOLN
INTRAMUSCULAR | Status: AC
  Filled 2017-11-28: qty 2

## 2017-11-28 MED ORDER — MIDAZOLAM HCL 2 MG/2ML IJ SOLN
INTRAMUSCULAR | Status: DC | PRN
  Administered 2017-11-28: 2 mg via INTRAVENOUS

## 2017-11-28 MED ORDER — OXYCODONE HCL 5 MG/5ML PO SOLN
5.0000 mg | Freq: Once | ORAL | Status: DC | PRN
  Filled 2017-11-28: qty 5

## 2017-11-28 MED ORDER — LACTATED RINGERS IV SOLN
INTRAVENOUS | Status: DC
  Administered 2017-11-28: 07:00:00 via INTRAVENOUS
  Filled 2017-11-28: qty 1000

## 2017-11-28 MED ORDER — DEXAMETHASONE SODIUM PHOSPHATE 10 MG/ML IJ SOLN
INTRAMUSCULAR | Status: DC | PRN
  Administered 2017-11-28: 10 mg via INTRAVENOUS

## 2017-11-28 MED ORDER — FENTANYL CITRATE (PF) 100 MCG/2ML IJ SOLN
25.0000 ug | INTRAMUSCULAR | Status: DC | PRN
  Filled 2017-11-28: qty 1

## 2017-11-28 MED ORDER — KETOROLAC TROMETHAMINE 30 MG/ML IJ SOLN
30.0000 mg | Freq: Once | INTRAMUSCULAR | Status: DC | PRN
Start: 2017-11-28 — End: 2017-11-28
  Filled 2017-11-28: qty 1

## 2017-11-28 MED ORDER — WHITE PETROLATUM EX OINT
TOPICAL_OINTMENT | CUTANEOUS | Status: AC
  Filled 2017-11-28: qty 5

## 2017-11-28 MED ORDER — CEFAZOLIN SODIUM-DEXTROSE 2-4 GM/100ML-% IV SOLN
INTRAVENOUS | Status: AC
  Filled 2017-11-28: qty 100

## 2017-11-28 MED ORDER — OXYCODONE HCL 5 MG PO TABS
5.0000 mg | ORAL_TABLET | Freq: Once | ORAL | Status: DC | PRN
  Filled 2017-11-28: qty 1

## 2017-11-28 MED ORDER — OXYCODONE HCL 5 MG PO TABS
5.0000 mg | ORAL_TABLET | ORAL | Status: DC | PRN
  Filled 2017-11-28: qty 2

## 2017-11-28 MED ORDER — PROPOFOL 10 MG/ML IV BOLUS
INTRAVENOUS | Status: DC | PRN
  Administered 2017-11-28: 150 mg via INTRAVENOUS

## 2017-11-28 MED ORDER — SODIUM CHLORIDE 0.9% FLUSH
3.0000 mL | Freq: Two times a day (BID) | INTRAVENOUS | Status: DC
  Filled 2017-11-28: qty 3

## 2017-11-28 MED ORDER — MIDAZOLAM HCL 2 MG/2ML IJ SOLN
INTRAMUSCULAR | Status: AC
  Filled 2017-11-28: qty 2

## 2017-11-28 MED ORDER — ACETAMINOPHEN 650 MG RE SUPP
650.0000 mg | RECTAL | Status: DC | PRN
  Filled 2017-11-28: qty 1

## 2017-11-28 MED ORDER — LIDOCAINE 2% (20 MG/ML) 5 ML SYRINGE
INTRAMUSCULAR | Status: DC | PRN
  Administered 2017-11-28: 100 mg via INTRAVENOUS

## 2017-11-28 MED ORDER — LIDOCAINE 2% (20 MG/ML) 5 ML SYRINGE
INTRAMUSCULAR | Status: AC
  Filled 2017-11-28: qty 5

## 2017-11-28 MED ORDER — PROPOFOL 10 MG/ML IV BOLUS
INTRAVENOUS | Status: AC
  Filled 2017-11-28: qty 40

## 2017-11-28 MED ORDER — ACETAMINOPHEN 325 MG PO TABS
650.0000 mg | ORAL_TABLET | ORAL | Status: DC | PRN
  Filled 2017-11-28: qty 2

## 2017-11-28 SURGICAL SUPPLY — 24 items
BAG DRAIN URO-CYSTO SKYTR STRL (DRAIN) ×2 IMPLANT
BAG URINE DRAINAGE (UROLOGICAL SUPPLIES) IMPLANT
BAG URINE LEG 19OZ MD ST LTX (BAG) IMPLANT
CATH FOLEY 2WAY SLVR  5CC 20FR (CATHETERS)
CATH FOLEY 2WAY SLVR  5CC 22FR (CATHETERS)
CATH FOLEY 2WAY SLVR 5CC 20FR (CATHETERS) IMPLANT
CATH FOLEY 2WAY SLVR 5CC 22FR (CATHETERS) IMPLANT
CLOTH BEACON ORANGE TIMEOUT ST (SAFETY) ×2 IMPLANT
DRSG TELFA 3X8 NADH (GAUZE/BANDAGES/DRESSINGS) ×2 IMPLANT
ELECT LOOP 22F BIPOLAR SML (ELECTROSURGICAL) ×2
ELECT REM PT RETURN 9FT ADLT (ELECTROSURGICAL)
ELECTRODE LOOP 22F BIPOLAR SML (ELECTROSURGICAL) ×1 IMPLANT
ELECTRODE REM PT RTRN 9FT ADLT (ELECTROSURGICAL) IMPLANT
GLOVE SURG SS PI 8.0 STRL IVOR (GLOVE) ×2 IMPLANT
GOWN STRL REUS W/TWL XL LVL3 (GOWN DISPOSABLE) ×2 IMPLANT
HOLDER FOLEY CATH W/STRAP (MISCELLANEOUS) IMPLANT
KIT RM TURNOVER CYSTO AR (KITS) ×2 IMPLANT
MANIFOLD NEPTUNE II (INSTRUMENTS) ×2 IMPLANT
NDL SAFETY ECLIPSE 18X1.5 (NEEDLE) ×1 IMPLANT
NEEDLE HYPO 18GX1.5 SHARP (NEEDLE) ×1
PACK CYSTO (CUSTOM PROCEDURE TRAY) ×2 IMPLANT
PLUG CATH AND CAP STER (CATHETERS) IMPLANT
SYRINGE IRR TOOMEY STRL 70CC (SYRINGE) IMPLANT
TUBE CONNECTING 12X1/4 (SUCTIONS) ×2 IMPLANT

## 2017-11-28 NOTE — Op Note (Signed)
Procedure: Cystoscopy with transurethral resection of medium bladder tumor.  Preop diagnosis: Recurrent bladder cancer and overlapping sites.  Postop diagnosis: Same with dimensions of 2 x 4 cm.  Surgeon: Dr. Irine Seal.  Anesthesia: General.  Specimen: 1.  Biopsies from left lateral wall. 2.  Biopsies from left trigone.  Drain: None.  EBL: None.  Complications: None.  Indications: The patient has a history of low-grade non-muscle invasive bladder cancer with an initial resection on 04/30/2017.  He underwent recent surveillance cystoscopy and was found to have lesions worrisome for recurrence in the left trigone and left lateral wall.  He returns today for biopsy and fulguration.  Procedure: He was taken to the operating room where he was given Ancef.  A general anesthetic was induced.  He was placed in the lithotomy position and fitted with PAS hose.  His perineum and genitalia were prepped with Betadine solution.  And he was draped in the usual sterile fashion.  Cystoscopy was performed using the 23 [place] scope with the 30 and 70 degree lenses.  Inspection revealed a normal urethra.  The external sphincter was intact.  The prostatic urethra was 2-3 cm in length with trilobar hyperplasia with a small middle lobe and some obstruction.  Examination of the bladder demonstrated mild trabeculation.  There was a resection scar on the posterior wall with some increased vascularity surrounding it but no worrisome lesions.  The right ureteral orifice was unremarkable.  The left ureteral orifice was surrounded by abnormal mucosa that was worrisome for recurrent tumor carcinoma in situ.  There was also an area approximately 2 cm in diameter on the left lateral wall adjacent to the prior resection scar with small papillary lesions worrisome for recurrent tumor.  Initially a cup biopsy forceps was used to take biopsies from the left trigone and the left lateral wall.  The urethra was then calibrated to  32 [place] with [employer] and the 26 French continuous-flow resectoscope sheath was inserted.  This was fitted with an [place] handle and a 26 loop.  Saline was used as the irrigant and bipolar current was used.  A small prominent area with abnormal mucosa just medial to the left ureteral orifice was resected and the specimen was sent with the left trigone biopsies.  This area was then fulgurated with destruction of all the abnormal mucosa that extended to the mid trigone and up to the bladder neck.  The ureteral orifice was spared but fulguration was carried circumferentially around the orifice to destroy all abnormal mucosa in this area.  I then took another resection biopsy from some of the papillary growths on the left lateral wall.  This lesion was also sent with the left lateral wall biopsies.  The remaining abnormal mucosa was then generously fulgurated and when complete covered an area approximately 2 x 4 cm with sparing of the orifice.  Once biopsy and fulguration was complete, final inspection revealed no retained tissue and no residual abnormal mucosa or papillary lesions.  The left ureteral orifice was intact.  The bladder was then drained and the resectoscope was removed.  It was not felt that a Foley catheter was required.  He was then taken down from the lithotomy position, his anesthetic was reversed and he was moved to the recovery room in stable condition.  There were no complications.

## 2017-11-28 NOTE — Anesthesia Procedure Notes (Signed)
Procedure Name: LMA Insertion Date/Time: 11/28/2017 8:50 AM Performed by: Suan Halter, CRNA Pre-anesthesia Checklist: Patient identified, Emergency Drugs available, Suction available and Patient being monitored Patient Re-evaluated:Patient Re-evaluated prior to induction Oxygen Delivery Method: Circle system utilized Preoxygenation: Pre-oxygenation with 100% oxygen Induction Type: IV induction Ventilation: Mask ventilation without difficulty LMA: LMA inserted LMA Size: 4.0 Number of attempts: 1 Airway Equipment and Method: Bite block Placement Confirmation: positive ETCO2 Tube secured with: Tape Dental Injury: Teeth and Oropharynx as per pre-operative assessment

## 2017-11-28 NOTE — Interval H&P Note (Signed)
History and Physical Interval Note:  11/28/2017 8:34 AM  Oscar Church  has presented today for surgery, with the diagnosis of BLADDER TUMOR  The various methods of treatment have been discussed with the patient and family. After consideration of risks, benefits and other options for treatment, the patient has consented to  Procedure(s): TRANSURETHRAL RESECTION OF BLADDER TUMOR (TURBT) (N/A) as a surgical intervention .  The patient's history has been reviewed, patient examined, no change in status, stable for surgery.  I have reviewed the patient's chart and labs.  Questions were answered to the patient's satisfaction.     Irine Seal

## 2017-11-28 NOTE — Discharge Instructions (Addendum)
CYSTOSCOPY HOME CARE INSTRUCTIONS ° °Activity: °Rest for the remainder of the day.  Do not drive or operate equipment today.  You may resume normal activities in one to two days as instructed by your physician.  ° °Meals: °Drink plenty of liquids and eat light foods such as gelatin or soup this evening.  You may return to a normal meal plan tomorrow. ° °Return to Work: °You may return to work in one to two days or as instructed by your physician. ° °Special Instructions / Symptoms: °Call your physician if any of these symptoms occur: ° ° -persistent or heavy bleeding ° -bleeding which continues after first few urination ° -large blood clots that are difficult to pass ° -urine stream diminishes or stops completely ° -fever equal to or higher than 101 degrees Farenheit. ° -cloudy urine with a strong, foul odor ° -severe pain ° You may feel some burning pain when you urinate.  This should disappear with time.  Applying moist heat to the lower abdomen or a hot tub bath may help relieve the pain.  ° ° ° ° °Post Anesthesia Home Care Instructions ° °Activity: °Get plenty of rest for the remainder of the day. A responsible individual must stay with you for 24 hours following the procedure.  °For the next 24 hours, DO NOT: °-Drive a car °-Operate machinery °-Drink alcoholic beverages °-Take any medication unless instructed by your physician °-Make any legal decisions or sign important papers. ° °Meals: °Start with liquid foods such as gelatin or soup. Progress to regular foods as tolerated. Avoid greasy, spicy, heavy foods. If nausea and/or vomiting occur, drink only clear liquids until the nausea and/or vomiting subsides. Call your physician if vomiting continues. ° °Special Instructions/Symptoms: °Your throat may feel dry or sore from the anesthesia or the breathing tube placed in your throat during surgery. If this causes discomfort, gargle with warm salt water. The discomfort should disappear within 24 hours. ° °If you  had a scopolamine patch placed behind your ear for the management of post- operative nausea and/or vomiting: ° °1. The medication in the patch is effective for 72 hours, after which it should be removed.  Wrap patch in a tissue and discard in the trash. Wash hands thoroughly with soap and water. °2. You may remove the patch earlier than 72 hours if you experience unpleasant side effects which may include dry mouth, dizziness or visual disturbances. °3. Avoid touching the patch. Wash your hands with soap and water after contact with the patch. °   ° °

## 2017-11-28 NOTE — Anesthesia Preprocedure Evaluation (Signed)
Anesthesia Evaluation  Patient identified by MRN, date of birth, ID band Patient awake    Reviewed: Allergy & Precautions, NPO status , Patient's Chart, lab work & pertinent test results  Airway Mallampati: II  TM Distance: >3 FB Neck ROM: Full    Dental no notable dental hx.    Pulmonary neg pulmonary ROS, former smoker,    Pulmonary exam normal breath sounds clear to auscultation       Cardiovascular negative cardio ROS Normal cardiovascular exam Rhythm:Regular Rate:Normal     Neuro/Psych negative neurological ROS  negative psych ROS   GI/Hepatic negative GI ROS, Neg liver ROS,   Endo/Other  negative endocrine ROS  Renal/GU negative Renal ROS  negative genitourinary   Musculoskeletal negative musculoskeletal ROS (+)   Abdominal   Peds negative pediatric ROS (+)  Hematology negative hematology ROS (+)   Anesthesia Other Findings   Reproductive/Obstetrics negative OB ROS                             Anesthesia Physical Anesthesia Plan  ASA: II  Anesthesia Plan: General   Post-op Pain Management:    Induction: Intravenous  PONV Risk Score and Plan: 2 and Ondansetron and Dexamethasone  Airway Management Planned: LMA  Additional Equipment:   Intra-op Plan:   Post-operative Plan: Extubation in OR  Informed Consent: I have reviewed the patients History and Physical, chart, labs and discussed the procedure including the risks, benefits and alternatives for the proposed anesthesia with the patient or authorized representative who has indicated his/her understanding and acceptance.   Dental advisory given  Plan Discussed with: CRNA and Surgeon  Anesthesia Plan Comments:         Anesthesia Quick Evaluation

## 2017-11-28 NOTE — Anesthesia Postprocedure Evaluation (Signed)
Anesthesia Post Note  Patient: Oscar Church  Procedure(s) Performed: TRANSURETHRAL RESECTION OF BLADDER TUMOR (TURBT) (N/A )     Patient location during evaluation: PACU Anesthesia Type: General Level of consciousness: awake and alert Pain management: pain level controlled Vital Signs Assessment: post-procedure vital signs reviewed and stable Respiratory status: spontaneous breathing, nonlabored ventilation, respiratory function stable and patient connected to nasal cannula oxygen Cardiovascular status: blood pressure returned to baseline and stable Postop Assessment: no apparent nausea or vomiting Anesthetic complications: no    Last Vitals:  Vitals:   11/28/17 0930 11/28/17 0945  BP: 121/85 107/83  Pulse: 81 79  Resp: 13 12  Temp:    SpO2: 96% 96%    Last Pain:  Vitals:   11/28/17 0649  TempSrc: Oral                 Jiovani Mccammon S

## 2017-11-28 NOTE — Transfer of Care (Signed)
Immediate Anesthesia Transfer of Care Note  Patient: Oscar Church  Procedure(s) Performed: Procedure(s) (LRB): TRANSURETHRAL RESECTION OF BLADDER TUMOR (TURBT) (N/A)  Patient Location: PACU  Anesthesia Type: General  Level of Consciousness: awake, oriented, sedated and patient cooperative  Airway & Oxygen Therapy: Patient Spontanous Breathing and Patient connected to face mask oxygen  Post-op Assessment: Report given to PACU RN and Post -op Vital signs reviewed and stable  Post vital signs: Reviewed and stable  Complications: No apparent anesthesia complications  Last Vitals:  Vitals:   11/28/17 0649 11/28/17 0922  BP: 126/81 116/82  Pulse: 69 86  Resp: 18 (!) 21  Temp: 36.6 C (!) 36.3 C  SpO2: 98% 96%    Last Pain:  Vitals:   11/28/17 0649  TempSrc: Oral      Patients Stated Pain Goal: 7 (16/57/90 3833)  Complications: No anesthetic complications

## 2017-11-29 ENCOUNTER — Encounter (HOSPITAL_BASED_OUTPATIENT_CLINIC_OR_DEPARTMENT_OTHER): Payer: Self-pay | Admitting: Urology

## 2018-08-22 ENCOUNTER — Other Ambulatory Visit: Payer: Self-pay | Admitting: Urology

## 2018-08-28 ENCOUNTER — Encounter (HOSPITAL_BASED_OUTPATIENT_CLINIC_OR_DEPARTMENT_OTHER): Payer: Self-pay

## 2018-08-28 ENCOUNTER — Other Ambulatory Visit: Payer: Self-pay

## 2018-08-28 NOTE — Progress Notes (Signed)
Spoke with: Broadus John NPO:  After Midnight, no gum, candy, or mints   Arrival time:  8301EX Labs: N/A AM medications: Fenofibrate Pre op orders: Yes Ride home:  Mordecai Rasmussen (wife) [phone]

## 2018-09-01 NOTE — H&P (Signed)
CC: I have bladder cancer that has been treated.  HPI: Oscar Church is a 57 year-old male established patient who is here for follow-up of bladder cancer treatment.  His bladder cancer was superficial and limitied to the bladder lining. His bladder cancer was not muscle invasive.   He did have a TURBT. His last bladder tumor was resected 04/30/2017.   He has not had blood in his urine recently.   His last cysto was 11/13/2017.   Oscar Church returns today in f/u for surveillance cystoscopy. He last had a TURBT on 11/28/17 for possible recurrence. His path just showed chronic inflammation. He had an initial TURBT on 04/30/17. He had several abnormal lesions in the bladder with biopsies from the posterior wall and bladder neck showing atypia and a lesion from the LL wall being LG NMIBC. He has no complaints today and has a clear urine.      CC: My PSA is elevated above the normal range.  HPI: His PSA is 2.73. He has had PSA's drawn prior to this one. He has had elevated PSA's prior to this one.   His PSA continues to fall     CC: AUA Questions Scoring.  HPI:     AUA Symptom Score: He never has the sensation of not emptying his bladder completely after finishing urinating. He never has to urinate again less that two hours after he has finished urinating. He does not have to stop and start again several times when he urinates. He never finds it difficult to postpone urination. He never has a weak urinary stream. He never has to push or strain to begin urination. He has to get up to urinate 1 time from the time he goes to bed until the time he gets up in the morning.   Calculated AUA Symptom Score: 1    ALLERGIES: None   MEDICATIONS: Fenofibrate  Vitamin C     GU PSH: Cystoscopy - 11/13/2017, 08/07/2017, 04/11/2017 Cystoscopy Fulguration - 04/30/2017 Cystoscopy TURBT 2-5 cm - 11/28/2017, 04/30/2017 Locm 300-399Mg /Ml Iodine,1Ml - 03/28/2017    NON-GU PSH: None       GU PMH: History of bladder  cancer, His repeat resection was benign. He will return in 6 months for cystoscopy. - 12/23/2017, He has persistent post op edema and inflammation particularly on the left with associated urgency and dysuria. I am going to given him Uribel and will repeat cystoscopy in 3 months. If the lesion persists at that time, he will need reresection. , - 08/07/2017 Bladder Cancer overlapping sites (Worsening), He has a possible recurrence on the left trigone and BN. Urine cytology today. I will set him up for a TURBT. I have reviewed the risks including bleeding, infection, ureteral and bladder injury, need for secondary procedures, thrombotic events and anesthetic complications. - 11/13/2017, He had LG NMIBC with patchy areas of atypia vs reactive disease. I am going to have him return in 3 months for cystoscopy with a cytology prior to the visit. I don't see a need for BCG at this time. , - 05/09/2017 Elevated PSA, His PSA has been falling over the last several months so I will continue to watch it with a repeat in 6 months. - 11/13/2017, He reports that Dr. Nancy Fetter told him his PSA is up. I have requested that result and will repeat the PSA with a week of abstinence in 3 months. The elevation is probably from his recent instrumentation., - 08/07/2017 Urinary Urgency, I am going to give him  Oxybutynin ER 5mg  daily. I have reviewed the side effects. - 08/07/2017 Bladder tumor/neoplasm, He has a bladder lesion on the right bladder base that is worrisome for CIS. I will get a cytology today and get him set up for a cystoscopy with biopsy and fulguration. Risks of bleeding, infection, bladder injury, thrombotic events and anesthetic complications reviewed. - 04/11/2017 Gross hematuria - 04/11/2017, He had single episode of gross hematuria with a negative CT. He will be set up to return for cystoscopy., - 03/28/2017    NON-GU PMH: Hypercholesterolemia    FAMILY HISTORY: 2 daughters - Other 1 son - Other nephrolithiasis - No  Family History   SOCIAL HISTORY: Marital Status: Married Preferred Language: English; Race: White Current Smoking Status: Patient smokes.  <DIV'  Tobacco Use Assessment Completed:  Used Tobacco in last 30 days?   Drinks 2 caffeinated drinks per day.    REVIEW OF SYSTEMS:    GU Review Male:  Patient denies frequent urination, hard to postpone urination, burning/ pain with urination, get up at night to urinate, leakage of urine, stream starts and stops, trouble starting your stream, have to strain to urinate , erection problems, and penile pain.   Gastrointestinal (Upper):  Patient denies nausea, vomiting, and indigestion/ heartburn.   Gastrointestinal (Lower):  Patient denies diarrhea and constipation.   Constitutional:  Patient denies fever, night sweats, weight loss, and fatigue.   Skin:  Patient denies skin rash/ lesion and itching.   Eyes:  Patient denies blurred vision and double vision.   Ears/ Nose/ Throat:  Patient denies sore throat and sinus problems.   Hematologic/Lymphatic:  Patient denies swollen glands and easy bruising.   Cardiovascular:  Patient denies leg swelling and chest pains.   Respiratory:  Patient denies cough and shortness of breath.   Endocrine:  Patient denies excessive thirst.   Musculoskeletal:  Patient denies back pain and joint pain.   Neurological:  Patient denies headaches and dizziness.   Psychologic:  Patient denies depression and anxiety.   VITAL SIGNS:      08/22/2018 07:37 AM    Weight 211 lb / 95.71 kg    BP 133/93 mmHg    Heart Rate 74 /min    MULTI-SYSTEM PHYSICAL EXAMINATION:     Constitutional: Well-nourished. No physical deformities. Normally developed. Good grooming.    Respiratory: No labored breathing, no use of accessory muscles. CTA    Cardiovascular: Normal temperature, RRR without murmur          PAST DATA REVIEWED:   Source Of History:  Patient  Lab Test Review:  PSA  Records Review:  AUA Symptom Score  Urine Test Review:   Urinalysis    08/14/18 11/05/17 07/23/17 04/22/17  PSA  Total PSA 2.73 ng/mL 3.51 ng/mL 3.96 ng/dl 4.03 ng/dl    PROCEDURES:    Flexible Cystoscopy - 52000  Risks, benefits, and some of the potential complications of the procedure were discussed. Cipro 500mg  given for antibiotic prophylaxis.     Meatus:  Normal size. Normal location. Normal condition.  Urethra:  No strictures.  External Sphincter:  Normal.  Verumontanum:  Normal.  Prostate:  Borderline obstructing. Moderate hyperplasia.  Bladder Neck:  Non-obstructing.  Ureteral Orifices:  Normal location. Normal size. Normal shape. Effluxed clear urine.  Bladder:  Mild trabeculation. Erythematous mucosa about 89mm on the right posterior wall, 64mm on the left lateral wall and patchy on the bladder neck. The LL wall is worrisome for CIS. No tumors. No stones.  The procedure was well tolerated and there were no complications.    Urinalysis  Dipstick Dipstick Cont'd  Color: Yellow Bilirubin: Neg mg/dL  Appearance: Clear Ketones: Neg mg/dL  Specific Gravity: 1.015 Blood: Neg ery/uL  pH: 5.5 Protein: Neg mg/dL  Glucose: Neg mg/dL Urobilinogen: 0.2 mg/dL   Nitrites: Neg   Leukocyte Esterase: Neg leu/uL    ASSESSMENT:     ICD-10 Details  1 GU:  Elevated PSA - R97.20 Improving - His PSA has fallen further. He will need a repeat in a year.   2  History of bladder cancer - Z85.51 He has erythematous bladder wall lesions with one on the left lateral wall that is suspicious for CIS. I will send a cytology today and get him set up for cystoscopy with Bil RTG's and biopsy with fulguration. I reviewed the risks in detail.   3  Bladder tumor/neoplasm - D41.4    PLAN:   Orders  Labs Urine Cytology  Schedule  Return Visit/Planned Activity: Next Available Appointment - Schedule Surgery

## 2018-09-02 ENCOUNTER — Encounter (HOSPITAL_BASED_OUTPATIENT_CLINIC_OR_DEPARTMENT_OTHER): Payer: Self-pay | Admitting: Emergency Medicine

## 2018-09-02 ENCOUNTER — Ambulatory Visit (HOSPITAL_BASED_OUTPATIENT_CLINIC_OR_DEPARTMENT_OTHER)
Admission: RE | Admit: 2018-09-02 | Discharge: 2018-09-02 | Disposition: A | Discharge status: Home / Self Care | Payer: BLUE CROSS/BLUE SHIELD | Source: Ambulatory Visit | Attending: Urology | Admitting: Urology

## 2018-09-02 ENCOUNTER — Encounter (HOSPITAL_BASED_OUTPATIENT_CLINIC_OR_DEPARTMENT_OTHER)
Admission: RE | Disposition: A | Discharge status: Home / Self Care | Payer: Self-pay | Source: Ambulatory Visit | Attending: Urology

## 2018-09-02 ENCOUNTER — Ambulatory Visit (HOSPITAL_BASED_OUTPATIENT_CLINIC_OR_DEPARTMENT_OTHER): Payer: BLUE CROSS/BLUE SHIELD | Admitting: Anesthesiology

## 2018-09-02 ENCOUNTER — Other Ambulatory Visit: Payer: Self-pay

## 2018-09-02 DIAGNOSIS — C670 Malignant neoplasm of trigone of bladder: Secondary | ICD-10-CM | POA: Diagnosis not present

## 2018-09-02 DIAGNOSIS — C672 Malignant neoplasm of lateral wall of bladder: Secondary | ICD-10-CM | POA: Diagnosis not present

## 2018-09-02 DIAGNOSIS — F17200 Nicotine dependence, unspecified, uncomplicated: Secondary | ICD-10-CM | POA: Insufficient documentation

## 2018-09-02 DIAGNOSIS — R972 Elevated prostate specific antigen [PSA]: Secondary | ICD-10-CM | POA: Diagnosis not present

## 2018-09-02 DIAGNOSIS — E7800 Pure hypercholesterolemia, unspecified: Secondary | ICD-10-CM | POA: Insufficient documentation

## 2018-09-02 DIAGNOSIS — C676 Malignant neoplasm of ureteric orifice: Secondary | ICD-10-CM | POA: Diagnosis not present

## 2018-09-02 DIAGNOSIS — Z79899 Other long term (current) drug therapy: Secondary | ICD-10-CM | POA: Diagnosis not present

## 2018-09-02 DIAGNOSIS — C679 Malignant neoplasm of bladder, unspecified: Secondary | ICD-10-CM | POA: Diagnosis present

## 2018-09-02 DIAGNOSIS — Z8551 Personal history of malignant neoplasm of bladder: Secondary | ICD-10-CM | POA: Diagnosis not present

## 2018-09-02 HISTORY — DX: Personal history of other diseases of the nervous system and sense organs: Z86.69

## 2018-09-02 HISTORY — DX: Unspecified abdominal hernia without obstruction or gangrene: K46.9

## 2018-09-02 HISTORY — PX: CYSTOSCOPY WITH BIOPSY: SHX5122

## 2018-09-02 HISTORY — PX: CYSTOSCOPY WITH FULGERATION: SHX6638

## 2018-09-02 HISTORY — PX: CYSTOSCOPY W/ RETROGRADES: SHX1426

## 2018-09-02 SURGERY — CYSTOSCOPY, WITH BIOPSY
Anesthesia: General | Site: Bladder

## 2018-09-02 MED ORDER — FENTANYL CITRATE (PF) 100 MCG/2ML IJ SOLN
25.0000 ug | INTRAMUSCULAR | Status: DC | PRN
  Filled 2018-09-02: qty 1

## 2018-09-02 MED ORDER — FENTANYL CITRATE (PF) 100 MCG/2ML IJ SOLN
INTRAMUSCULAR | Status: AC
  Filled 2018-09-02: qty 2

## 2018-09-02 MED ORDER — DEXAMETHASONE SODIUM PHOSPHATE 4 MG/ML IJ SOLN
INTRAMUSCULAR | Status: DC | PRN
  Administered 2018-09-02: 10 mg via INTRAVENOUS

## 2018-09-02 MED ORDER — ONDANSETRON HCL 4 MG/2ML IJ SOLN
INTRAMUSCULAR | Status: DC | PRN
  Administered 2018-09-02: 4 mg via INTRAVENOUS

## 2018-09-02 MED ORDER — FENTANYL CITRATE (PF) 100 MCG/2ML IJ SOLN
INTRAMUSCULAR | Status: DC | PRN
  Administered 2018-09-02 (×5): 25 ug via INTRAVENOUS
  Administered 2018-09-02: 50 ug via INTRAVENOUS
  Administered 2018-09-02: 25 ug via INTRAVENOUS

## 2018-09-02 MED ORDER — LIDOCAINE HCL (CARDIAC) PF 100 MG/5ML IV SOSY
PREFILLED_SYRINGE | INTRAVENOUS | Status: DC | PRN
  Administered 2018-09-02: 80 mg via INTRAVENOUS

## 2018-09-02 MED ORDER — CEFAZOLIN SODIUM-DEXTROSE 2-4 GM/100ML-% IV SOLN
INTRAVENOUS | Status: AC
  Filled 2018-09-02: qty 100

## 2018-09-02 MED ORDER — MIDAZOLAM HCL 5 MG/5ML IJ SOLN
INTRAMUSCULAR | Status: DC | PRN
  Administered 2018-09-02: 2 mg via INTRAVENOUS

## 2018-09-02 MED ORDER — DEXAMETHASONE SODIUM PHOSPHATE 10 MG/ML IJ SOLN
INTRAMUSCULAR | Status: AC
  Filled 2018-09-02: qty 1

## 2018-09-02 MED ORDER — PROPOFOL 10 MG/ML IV BOLUS
INTRAVENOUS | Status: DC | PRN
  Administered 2018-09-02: 50 mg via INTRAVENOUS
  Administered 2018-09-02: 200 mg via INTRAVENOUS

## 2018-09-02 MED ORDER — PROMETHAZINE HCL 25 MG/ML IJ SOLN
6.2500 mg | INTRAMUSCULAR | Status: DC | PRN
  Filled 2018-09-02: qty 1

## 2018-09-02 MED ORDER — LIDOCAINE 2% (20 MG/ML) 5 ML SYRINGE
INTRAMUSCULAR | Status: AC
  Filled 2018-09-02: qty 5

## 2018-09-02 MED ORDER — ONDANSETRON HCL 4 MG/2ML IJ SOLN
INTRAMUSCULAR | Status: AC
  Filled 2018-09-02: qty 2

## 2018-09-02 MED ORDER — CEFAZOLIN SODIUM-DEXTROSE 2-4 GM/100ML-% IV SOLN
2.0000 g | INTRAVENOUS | Status: AC
  Administered 2018-09-02: 2 g via INTRAVENOUS
  Filled 2018-09-02: qty 100

## 2018-09-02 MED ORDER — LACTATED RINGERS IV SOLN
INTRAVENOUS | Status: DC
  Administered 2018-09-02: 07:00:00 via INTRAVENOUS
  Filled 2018-09-02: qty 1000

## 2018-09-02 MED ORDER — IOHEXOL 300 MG/ML  SOLN
INTRAMUSCULAR | Status: DC | PRN
  Administered 2018-09-02: 10 mL

## 2018-09-02 MED ORDER — MIDAZOLAM HCL 2 MG/2ML IJ SOLN
INTRAMUSCULAR | Status: AC
  Filled 2018-09-02: qty 2

## 2018-09-02 MED ORDER — PROPOFOL 10 MG/ML IV BOLUS
INTRAVENOUS | Status: AC
  Filled 2018-09-02: qty 40

## 2018-09-02 MED ORDER — KETOROLAC TROMETHAMINE 30 MG/ML IJ SOLN
INTRAMUSCULAR | Status: AC
  Filled 2018-09-02: qty 1

## 2018-09-02 MED ORDER — STERILE WATER FOR IRRIGATION IR SOLN
Status: DC | PRN
  Administered 2018-09-02: 3000 mL

## 2018-09-02 MED ORDER — KETOROLAC TROMETHAMINE 30 MG/ML IJ SOLN
30.0000 mg | Freq: Once | INTRAMUSCULAR | Status: AC | PRN
  Administered 2018-09-02: 30 mg via INTRAVENOUS
  Filled 2018-09-02: qty 1

## 2018-09-02 SURGICAL SUPPLY — 33 items
BAG DRAIN URO-CYSTO SKYTR STRL (DRAIN) ×4 IMPLANT
BASKET STONE 1.7 NGAGE (UROLOGICAL SUPPLIES) IMPLANT
BASKET ZERO TIP NITINOL 2.4FR (BASKET) IMPLANT
CATH FOLEY 2WAY SLVR  5CC 16FR (CATHETERS)
CATH FOLEY 2WAY SLVR 5CC 16FR (CATHETERS) IMPLANT
CATH URET 5FR 28IN CONE TIP (BALLOONS)
CATH URET 5FR 28IN OPEN ENDED (CATHETERS) ×4 IMPLANT
CATH URET 5FR 70CM CONE TIP (BALLOONS) IMPLANT
CLOTH BEACON ORANGE TIMEOUT ST (SAFETY) ×4 IMPLANT
ELECT REM PT RETURN 9FT ADLT (ELECTROSURGICAL) ×4
ELECTRODE REM PT RTRN 9FT ADLT (ELECTROSURGICAL) ×3 IMPLANT
FIBER LASER FLEXIVA 365 (UROLOGICAL SUPPLIES) IMPLANT
FIBER LASER TRAC TIP (UROLOGICAL SUPPLIES) IMPLANT
GLOVE SURG SS PI 8.0 STRL IVOR (GLOVE) ×4 IMPLANT
GOWN STRL REUS W/ TWL LRG LVL3 (GOWN DISPOSABLE) ×6 IMPLANT
GOWN STRL REUS W/ TWL XL LVL3 (GOWN DISPOSABLE) IMPLANT
GOWN STRL REUS W/TWL LRG LVL3 (GOWN DISPOSABLE) ×2
GOWN STRL REUS W/TWL XL LVL3 (GOWN DISPOSABLE) ×4 IMPLANT
GUIDEWIRE ANG ZIPWIRE 038X150 (WIRE) IMPLANT
GUIDEWIRE STR DUAL SENSOR (WIRE) ×4 IMPLANT
INFUSOR MANOMETER BAG 3000ML (MISCELLANEOUS) ×4 IMPLANT
IV NS IRRIG 3000ML ARTHROMATIC (IV SOLUTION) ×4 IMPLANT
KIT TURNOVER CYSTO (KITS) ×4 IMPLANT
MANIFOLD NEPTUNE II (INSTRUMENTS) ×4 IMPLANT
NDL SAFETY ECLIPSE 18X1.5 (NEEDLE) IMPLANT
NEEDLE HYPO 18GX1.5 SHARP (NEEDLE)
NEEDLE HYPO 22GX1.5 SAFETY (NEEDLE) IMPLANT
NS IRRIG 500ML POUR BTL (IV SOLUTION) ×4 IMPLANT
PACK CYSTO (CUSTOM PROCEDURE TRAY) ×4 IMPLANT
SYR 20CC LL (SYRINGE) IMPLANT
TUBE CONNECTING 12X1/4 (SUCTIONS) ×4 IMPLANT
TUBING UROLOGY SET (TUBING) IMPLANT
WATER STERILE IRR 3000ML UROMA (IV SOLUTION) ×4 IMPLANT

## 2018-09-02 NOTE — Anesthesia Postprocedure Evaluation (Signed)
Anesthesia Post Note  Patient: Oscar Church  Procedure(s) Performed: CYSTOSCOPY WITH BIOPSY (N/A Bladder) CYSTOSCOPY WITH FULGERATION (N/A ) CYSTOSCOPY WITH RETROGRADE PYELOGRAM (Bilateral )     Patient location during evaluation: PACU Anesthesia Type: General Level of consciousness: awake and alert Pain management: pain level controlled Vital Signs Assessment: post-procedure vital signs reviewed and stable Respiratory status: spontaneous breathing, nonlabored ventilation, respiratory function stable and patient connected to nasal cannula oxygen Cardiovascular status: blood pressure returned to baseline and stable Postop Assessment: no apparent nausea or vomiting Anesthetic complications: no    Last Vitals:  Vitals:   09/02/18 0915 09/02/18 0930  BP: 129/84 (!) 131/95  Pulse: 71 65  Resp: 10 14  Temp:    SpO2: 99% 99%    Last Pain:  Vitals:   09/02/18 0930  TempSrc:   PainSc: 0-No pain                 Bora Bost S

## 2018-09-02 NOTE — Op Note (Signed)
Procedure: 1.  Cystoscopy with bilateral retrograde pyelograms and interpretation. 2.  Biopsy and fulguration of 7 mm lesion from the right trigone. 3.  Biopsy and fulguration of 3 mm lesion from the left ureteral orifice. 4.  Biopsy and fulguration of 15 mm lesion from the left lateral wall.  Preop diagnosis: Recurrent urothelial carcinoma.  Postop diagnosis: Same.  Surgeon: Dr. Irine Seal.  Anesthesia: General.  Specimen: Cup biopsies from the lesions of the right trigone left ureteral orifice and left lateral wall.  Drain: None.  EBL: None.  Complications: None.  Indications: Oscar Church is a 57 year old male with a history of low-grade urothelial carcinoma who was found on surveillance cystoscopy to have lesions in the year of the right trigone and left lateral wall possibly the bladder neck suspicious for recurrent neoplasm.  His cytology was atypical.  He is to undergo cystoscopy with bilateral retrograde pyelography and biopsy and fulguration of the suspicious lesions.  Procedure: He was taken operating room where he was given Ancef.  General anesthetic was induced.  He was placed in lithotomy position and fitted with PAS hose.  His perineum and genitalia were prepped with Betadine solution and draped in usual sterile fashion.  Cystoscopy was performed using the 23 [place] scope and 30 degree lens.  Examination revealed a normal urethra.  The external sphincter was intact.  The prostatic urethra was approximately 3 cm in length with trilobar hyperplasia with some obstruction.  The bladder had mild trabeculation.  There were erythematous lesions in the right trigone superior to the ureteral orifice, on the lateral edge of the left ureteral orifice.  And on the left lateral wall with the measurements noted above.  These lesions have papillary features but were spreading and more suggestive of carcinoma in situ versus recurrent papillary urothelial carcinoma.  The right ureteral orifice was  unremarkable.  The left ureteral orifice was somewhat lateral and widely patent with evidence of prior resection.  Bilateral retrograde pyelography was performed with a 5 French opening catheter and Omnipaque.  The right retrograde pyelogram demonstrated normal ureter and intrarenal collecting system without filling defects.  The left retrograde pyelogram demonstrated a normal ureter and intrarenal collecting system without filling defects.  After completion of retrograde pyelography, a cup biopsy forceps was used to obtain a biopsy from the right trigone lesion, the left ureteral orifice lesion and the left lateral wall lesion.  2 biopsies were obtained from the larger left lateral wall lesion.  A Bugbee electrode was then used to widely fulgurate the biopsy sites and the surrounding abnormal mucosa.  The left ureteral biopsy site was more lightly fulgurated to avoid ureteral injury.  Once hemostasis was achieved, final inspection revealed no residual lesions of concern and no active bleeding.  The bladder was partially drained and it was not felt that a Foley catheter was indicated.  Final inspection of the bladder neck and prostatic urethra demonstrated no worrisome lesions that would necessitate additional biopsies.  The scope was removed, he was taken down from the lithotomy position, his anesthetic was reversed and he was moved to recovery room in stable condition.  There were no complications.  I elected not to give gemcitabine because he is going to need BCG therapy postoperatively.

## 2018-09-02 NOTE — Discharge Instructions (Addendum)
Bacillus Calmette-Guerin Live, BCG intravesical solution What is this medicine? BACILLUS CALMETTE-GUERIN LIVE, BCG (ba SIL [place] KAL met gay RAYN) is a bacteria solution. This medicine stimulates the immune system to ward off cancer cells. It is used to treat bladder cancer. This medicine may be used for other purposes; ask your health care provider or pharmacist if you have questions. COMMON BRAND NAME(S): Theracys, TICE BCG What should I tell my health care provider before I take this medicine? They need to know if you have any of these conditions: -aneurysm -blood in the urine -bladder biopsy within 2 weeks -fever or infection -immune system problems -leukemia -lymphoma -myasthenia gravis -need organ transplant -prosthetic device like arterial graft, artificial joint, prosthetic heart valve -recent or ongoing radiation therapy -tuberculosis -an unusual or allergic reaction to Bacillus Calmette-Guerin Live, BCG, latex, other medicines, foods, dyes, or preservatives -pregnant or trying to get pregnant -breast-feeding How should I use this medicine? This drug is given as a catheter infusion into the bladder. It is administered in a hospital or clinic by a specially trained health care professional. Dennis Bast will be given directions to follow before the treatment. Follow your doctor's directions carefully. Try to hold this medicine in your bladder for 2 hours after treatment. Talk to your pediatrician regarding the use of this medicine in children. Special care may be needed. Overdosage: If you think you have taken too much of this medicine contact a poison control center or emergency room at once. NOTE: This medicine is only for you. Do not share this medicine with others. What if I miss a dose? It is important not to miss your dose. Call your doctor or health care professional if you are unable to keep an appointment. What may interact with this medicine? -antibiotics -medicines to suppress  your immune system like chemotherapy agents or corticosteroids -medicine to treat tuberculosis This list may not describe all possible interactions. Give your health care provider a list of all the medicines, herbs, non-prescription drugs, or dietary supplements you use. Also tell them if you smoke, drink alcohol, or use illegal drugs. Some items may interact with your medicine. What should I watch for while using this medicine? Visit your doctor for checks on your progress. This drug may make you feel generally unwell. Contact your doctor if your symptoms last more than 2 days or if they get worse. Call your doctor right away if you have a severe or unusual symptom. Infection can be spread to others through contact with this medicine. To prevent the spread of infection follow your doctor's directions carefully after treatment. For the first 6 hours after each treatment, sit down on the toilet to urinate. After urinating, add 2 cups of bleach to the toilet bowl and let set for 15 minutes before flushing. Wash your hands before and after using the restroom. Drink water or other fluids as directed after treatment with this medicine. Do not become pregnant while taking this medicine. Women should inform their doctor if they wish to become pregnant or think they might be pregnant. There is a potential for serious side effects to an unborn child. Talk to your health care professional or pharmacist for more information. Do not breast-feed an infant while taking this medicine. What side effects may I notice from receiving this medicine? Side effects that you should report to your doctor or health care professional as soon as possible: -allergic reactions like skin rash, itching or hives, swelling of the face, lips, or tongue -signs of  infection - fever or chills, cough, sore throat, pain or difficulty passing urine -signs of decreased red blood cells - unusually weak or tired, fainting spells,  lightheadedness -blood in urine -breathing problems -cough -eye pain, redness -flu-like symptoms -joint pain -bladder-area pain for more than 2 days after treatment -trouble passing urine or change in the amount of urine -vomiting -yellowing of the eyes or skin Side effects that usually do not require medical attention (report to your doctor or health care professional if they continue or are bothersome): -bladder spasm -burning when passing urine within 2 days of treatment -feel need to pass urine often or wake up at night to pass urine -loss of appetite This list may not describe all possible side effects. Call your doctor for medical advice about side effects. You may report side effects to FDA at 1-800-FDA-1088. Where should I keep my medicine? This drug is given in a hospital or clinic and will not be stored at home. NOTE: This sheet is a summary. It may not cover all possible information. If you have questions about this medicine, talk to your doctor, pharmacist, or health care provider.  2018 Elsevier/Gold Standard (2015-11-17 10:33:35)   CYSTOSCOPY HOME CARE INSTRUCTIONS  Activity: Rest for the remainder of the day.  Do not drive or operate equipment today.  You may resume normal activities in one to two days as instructed by your physician.   Meals: Drink plenty of liquids and eat light foods such as gelatin or soup this evening.  You may return to a normal meal plan tomorrow.  Return to Work: You may return to work in one to two days or as instructed by your physician.  Special Instructions / Symptoms: Call your physician if any of these symptoms occur:   -persistent or heavy bleeding  -bleeding which continues after first few urination  -large blood clots that are difficult to pass  -urine stream diminishes or stops completely  -fever equal to or higher than 101 degrees Farenheit.  -cloudy urine with a strong, foul odor  -severe pain  Females should always  wipe from front to back after elimination.  You may feel some burning pain when you urinate.  This should disappear with time.  Applying moist heat to the lower abdomen or a hot tub bath may help relieve the pain. \    Patient Signature:  ________________________________________________________  Nurse's Signature:  ________________________________________________________     Post Anesthesia Home Care Instructions  Activity: Get plenty of rest for the remainder of the day. A responsible individual must stay with you for 24 hours following the procedure.  For the next 24 hours, DO NOT: -Drive a car -Paediatric nurse -Drink alcoholic beverages -Take any medication unless instructed by your physician -Make any legal decisions or sign important papers.  Meals: Start with liquid foods such as gelatin or soup. Progress to regular foods as tolerated. Avoid greasy, spicy, heavy foods. If nausea and/or vomiting occur, drink only clear liquids until the nausea and/or vomiting subsides. Call your physician if vomiting continues.  Special Instructions/Symptoms: Your throat may feel dry or sore from the anesthesia or the breathing tube placed in your throat during surgery. If this causes discomfort, gargle with warm salt water. The discomfort should disappear within 24 hours.  If you had a scopolamine patch placed behind your ear for the management of post- operative nausea and/or vomiting:  1. The medication in the patch is effective for 72 hours, after which it should be removed.  Wrap patch in a tissue and discard in the trash. Wash hands thoroughly with soap and water. 2. You may remove the patch earlier than 72 hours if you experience unpleasant side effects which may include dry mouth, dizziness or visual disturbances. 3. Avoid touching the patch. Wash your hands with soap and water after contact with the patch.

## 2018-09-02 NOTE — Interval H&P Note (Signed)
History and Physical Interval Note:  09/02/2018 8:02 AM  Oscar Church  has presented today for surgery, with the diagnosis of BLADDER WALL LESIONS, HISTORY OF BLADDER CANCER  The various methods of treatment have been discussed with the patient and family. After consideration of risks, benefits and other options for treatment, the patient has consented to  Procedure(s) with comments: CYSTOSCOPY WITH BIOPSY (N/A) - 1 HR CYSTOSCOPY WITH FULGERATION (N/A) CYSTOSCOPY WITH RETROGRADE PYELOGRAM (Bilateral) as a surgical intervention .  The patient's history has been reviewed, patient examined, no change in status, stable for surgery.  I have reviewed the patient's chart and labs.  Questions were answered to the patient's satisfaction.     Irine Seal

## 2018-09-02 NOTE — Transfer of Care (Signed)
Immediate Anesthesia Transfer of Care Note  Patient: Oscar Church  Procedure(s) Performed: Procedure(s) (LRB): CYSTOSCOPY WITH BIOPSY (N/A) CYSTOSCOPY WITH FULGERATION (N/A) CYSTOSCOPY WITH RETROGRADE PYELOGRAM (Bilateral)  Patient Location: PACU  Anesthesia Type: General  Level of Consciousness: awake, sedated, patient cooperative and responds to stimulation  Airway & Oxygen Therapy: Patient Spontanous Breathing and Patient connected to [place] oxygen  Post-op Assessment: Report given to PACU RN, Post -op Vital signs reviewed and stable and Patient moving all extremities  Post vital signs: Reviewed and stable  Complications: No apparent anesthesia complications

## 2018-09-02 NOTE — Anesthesia Preprocedure Evaluation (Signed)
Anesthesia Evaluation  Patient identified by MRN, date of birth, ID band Patient awake    Reviewed: Allergy & Precautions, NPO status , Patient's Chart, lab work & pertinent test results  Airway Mallampati: II  TM Distance: >3 FB Neck ROM: Full    Dental no notable dental hx.    Pulmonary neg pulmonary ROS, former smoker,    Pulmonary exam normal breath sounds clear to auscultation       Cardiovascular negative cardio ROS Normal cardiovascular exam Rhythm:Regular Rate:Normal     Neuro/Psych negative neurological ROS  negative psych ROS   GI/Hepatic negative GI ROS, Neg liver ROS,   Endo/Other  negative endocrine ROS  Renal/GU negative Renal ROS  negative genitourinary   Musculoskeletal negative musculoskeletal ROS (+)   Abdominal   Peds negative pediatric ROS (+)  Hematology negative hematology ROS (+)   Anesthesia Other Findings   Reproductive/Obstetrics negative OB ROS                             Anesthesia Physical Anesthesia Plan  ASA: II  Anesthesia Plan: General   Post-op Pain Management:    Induction: Intravenous  PONV Risk Score and Plan: 2 and Ondansetron, Dexamethasone and Treatment may vary due to age or medical condition  Airway Management Planned: LMA  Additional Equipment:   Intra-op Plan:   Post-operative Plan: Extubation in OR  Informed Consent: I have reviewed the patients History and Physical, chart, labs and discussed the procedure including the risks, benefits and alternatives for the proposed anesthesia with the patient or authorized representative who has indicated his/her understanding and acceptance.   Dental advisory given  Plan Discussed with: CRNA and Surgeon  Anesthesia Plan Comments:         Anesthesia Quick Evaluation  

## 2018-09-02 NOTE — Anesthesia Procedure Notes (Signed)
Procedure Name: LMA Insertion Date/Time: 09/02/2018 8:26 AM Performed by: Justice Rocher, CRNA Pre-anesthesia Checklist: Patient identified, Emergency Drugs available, Suction available and Patient being monitored Patient Re-evaluated:Patient Re-evaluated prior to induction Oxygen Delivery Method: Circle system utilized Preoxygenation: Pre-oxygenation with 100% oxygen Induction Type: IV induction Ventilation: Mask ventilation without difficulty LMA: LMA inserted LMA Size: 5.0 Number of attempts: 1 Airway Equipment and Method: Bite block Placement Confirmation: positive ETCO2 and breath sounds checked- equal and bilateral Tube secured with: Tape Dental Injury: Teeth and Oropharynx as per pre-operative assessment

## 2018-09-03 ENCOUNTER — Encounter (HOSPITAL_BASED_OUTPATIENT_CLINIC_OR_DEPARTMENT_OTHER): Payer: Self-pay | Admitting: Urology

## 2019-03-20 ENCOUNTER — Emergency Department (HOSPITAL_COMMUNITY)
Admission: EM | Admit: 2019-03-20 | Discharge: 2019-03-21 | Disposition: A | Discharge status: Home / Self Care | Payer: Managed Care, Other (non HMO) | Attending: Emergency Medicine | Admitting: Emergency Medicine

## 2019-03-20 ENCOUNTER — Encounter (HOSPITAL_COMMUNITY): Payer: Self-pay | Admitting: Emergency Medicine

## 2019-03-20 ENCOUNTER — Other Ambulatory Visit: Payer: Self-pay

## 2019-03-20 DIAGNOSIS — K859 Acute pancreatitis without necrosis or infection, unspecified: Secondary | ICD-10-CM | POA: Insufficient documentation

## 2019-03-20 DIAGNOSIS — Z87891 Personal history of nicotine dependence: Secondary | ICD-10-CM | POA: Insufficient documentation

## 2019-03-20 DIAGNOSIS — R1011 Right upper quadrant pain: Secondary | ICD-10-CM | POA: Diagnosis present

## 2019-03-20 DIAGNOSIS — Z79899 Other long term (current) drug therapy: Secondary | ICD-10-CM | POA: Insufficient documentation

## 2019-03-20 MED ORDER — SODIUM CHLORIDE 0.9% FLUSH: 3.0000 mL | Freq: Once | INTRAVENOUS | Status: DC

## 2019-03-20 NOTE — ED Triage Notes (Signed)
Pt in POV, reports RUQ abd pain since this AM. Denies N/V/D. Sharp in nature, 6/10.

## 2019-03-21 ENCOUNTER — Encounter (HOSPITAL_COMMUNITY): Payer: Self-pay | Admitting: Emergency Medicine

## 2019-03-21 ENCOUNTER — Emergency Department (HOSPITAL_COMMUNITY): Payer: Managed Care, Other (non HMO)

## 2019-03-21 LAB — COMPREHENSIVE METABOLIC PANEL
ALT: 24 U/L (ref 0–44)
AST: 26 U/L (ref 15–41)
Albumin: 4.2 g/dL (ref 3.5–5.0)
Alkaline Phosphatase: 41 U/L (ref 38–126)
Anion gap: 8 (ref 5–15)
BUN: 20 mg/dL (ref 6–20)
CO2: 23 mmol/L (ref 22–32)
Calcium: 9.5 mg/dL (ref 8.9–10.3)
Chloride: 105 mmol/L (ref 98–111)
Creatinine, Ser: 1.48 mg/dL — ABNORMAL HIGH (ref 0.61–1.24)
GFR calc Af Amer: 60 mL/min — ABNORMAL LOW (ref >60)
GFR calc non Af Amer: 51 mL/min — ABNORMAL LOW (ref >60)
Glucose, Bld: 116 mg/dL — ABNORMAL HIGH (ref 70–99)
Potassium: 4.5 mmol/L (ref 3.5–5.1)
Sodium: 136 mmol/L (ref 135–145)
Total Bilirubin: 1 mg/dL (ref 0.3–1.2)
Total Protein: 6.6 g/dL (ref 6.5–8.1)

## 2019-03-21 LAB — URINALYSIS, ROUTINE W REFLEX MICROSCOPIC
Bilirubin Urine: NEGATIVE
Glucose, UA: NEGATIVE mg/dL
Hgb urine dipstick: NEGATIVE
Ketones, ur: NEGATIVE mg/dL
Leukocytes,Ua: NEGATIVE
Nitrite: NEGATIVE
Protein, ur: NEGATIVE mg/dL
Specific Gravity, Urine: 1.023 (ref 1.005–1.030)
pH: 6 (ref 5.0–8.0)

## 2019-03-21 LAB — CBC
HCT: 45.6 % (ref 39.0–52.0)
Hemoglobin: 14.9 g/dL (ref 13.0–17.0)
MCH: 29.3 pg (ref 26.0–34.0)
MCHC: 32.7 g/dL (ref 30.0–36.0)
MCV: 89.6 fL (ref 80.0–100.0)
Platelets: 266 10*3/uL (ref 150–400)
RBC: 5.09 MIL/uL (ref 4.22–5.81)
RDW: 12.4 % (ref 11.5–15.5)
WBC: 13.3 10*3/uL — ABNORMAL HIGH (ref 4.0–10.5)
nRBC: 0 % (ref 0.0–0.2)

## 2019-03-21 LAB — LIPASE, BLOOD: Lipase: 81 U/L — ABNORMAL HIGH (ref 11–51)

## 2019-03-21 MED ORDER — DICYCLOMINE HCL 20 MG PO TABS: 20.0000 mg | ORAL_TABLET | Freq: Two times a day (BID) | ORAL | 0 refills | Status: DC

## 2019-03-21 MED ORDER — ALUM & MAG HYDROXIDE-SIMETH 200-200-20 MG/5ML PO SUSP
30.0000 mL | Freq: Once | ORAL | Status: AC
  Administered 2019-03-21: 06:00:00 30 mL via ORAL
  Filled 2019-03-21: qty 30

## 2019-03-21 MED ORDER — OXYCODONE-ACETAMINOPHEN 5-325 MG PO TABS: 1.0000 | ORAL_TABLET | Freq: Four times a day (QID) | ORAL | 0 refills | Status: DC | PRN

## 2019-03-21 MED ORDER — LIDOCAINE VISCOUS HCL 2 % MT SOLN
15.0000 mL | Freq: Once | OROMUCOSAL | Status: AC
  Administered 2019-03-21: 15 mL via ORAL
  Filled 2019-03-21: qty 15

## 2019-03-21 MED ORDER — FENTANYL CITRATE (PF) 100 MCG/2ML IJ SOLN
100.0000 ug | Freq: Once | INTRAMUSCULAR | Status: AC
  Administered 2019-03-21: 100 ug via INTRAVENOUS
  Filled 2019-03-21: qty 2

## 2019-03-21 MED ORDER — OMEPRAZOLE 20 MG PO CPDR: 20.0000 mg | DELAYED_RELEASE_CAPSULE | Freq: Every day | ORAL | 0 refills | Status: DC

## 2019-03-21 MED ORDER — ONDANSETRON HCL 4 MG/2ML IJ SOLN
4.0000 mg | Freq: Once | INTRAMUSCULAR | Status: AC
  Administered 2019-03-21: 06:00:00 4 mg via INTRAVENOUS
  Filled 2019-03-21: qty 2

## 2019-03-21 NOTE — ED Notes (Signed)
Discharge instructions reviewed with patient. Patient verbalized understanding. Wife is picking up patient. Signature pad not working in room.

## 2019-03-21 NOTE — ED Provider Notes (Signed)
[facility] EMERGENCY DEPARTMENT Provider Note   CSN: [id number] Arrival date & time: 03/20/19  2341    History   Chief Complaint Chief Complaint  Patient presents with  . Abdominal Pain    HPI Oscar Church is a 58 y.o. male.     The history is provided by the patient.  Abdominal Pain  Pain location:  RUQ Pain quality: dull   Pain quality comment:  Feels stuck Pain radiates to:  Does not radiate Pain severity:  Severe Onset quality:  Sudden Duration:  18 hours Timing:  Constant Progression:  Unchanged Chronicity:  New Context: eating   Relieved by:  Nothing Worsened by:  Nothing Ineffective treatments:  None tried Associated symptoms: no chest pain, no constipation, no cough, no diarrhea, no fever, no flatus, no shortness of breath, no sore throat and no vomiting   Risk factors: no alcohol abuse and has not had multiple surgeries   States he ate a [place] and roast beef sandwich with mayonnaise.  Does NOT feel like his previous pancreatitis.  No f/c/r.  No diarrhea.  No vomiting.    Past Medical History:  Diagnosis Date  . Abdominal hernia   . History of impacted cerumen 04/28/2014   Bilateral   . History of pancreatitis    age 52  . Lesion of bladder     There are no active problems to display for this patient.   Past Surgical History:  Procedure Laterality Date  . CYSTOSCOPY W/ RETROGRADES Bilateral 09/02/2018   Procedure: CYSTOSCOPY WITH RETROGRADE PYELOGRAM;  Surgeon: Irine Seal, MD;  Location: [facility];  Service: Urology;  Laterality: Bilateral;  . CYSTOSCOPY WITH BIOPSY N/A 04/30/2017   Procedure: CYSTOSCOPY WITH BIOPSY;  Surgeon: Irine Seal, MD;  Location: [facility];  Service: Urology;  Laterality: N/A;  . CYSTOSCOPY WITH BIOPSY N/A 09/02/2018   Procedure: CYSTOSCOPY WITH BIOPSY;  Surgeon: Irine Seal, MD;  Location: [facility];  Service: Urology;  Laterality: N/A;  1 HR  .  CYSTOSCOPY WITH FULGERATION N/A 04/30/2017   Procedure: CYSTOSCOPY WITH FULGERATION;  Surgeon: Irine Seal, MD;  Location: [facility];  Service: Urology;  Laterality: N/A;  . CYSTOSCOPY WITH FULGERATION N/A 09/02/2018   Procedure: CYSTOSCOPY WITH FULGERATION;  Surgeon: Irine Seal, MD;  Location: [facility];  Service: Urology;  Laterality: N/A;  . ROOT CANAL  11/20/2017  . TONSILLECTOMY AND ADENOIDECTOMY  child  . TRANSURETHRAL RESECTION OF BLADDER TUMOR N/A 11/28/2017   Procedure: TRANSURETHRAL RESECTION OF BLADDER TUMOR (TURBT);  Surgeon: Irine Seal, MD;  Location: [facility];  Service: Urology;  Laterality: N/A;        Home Medications    Prior to Admission medications   Medication Sig Start Date End Date Taking? Authorizing Provider  fenofibrate 160 MG tablet Take 160 mg by mouth every morning.     [provider]    Family History No family history on file.  Social History Social History   Tobacco Use  . Smoking status: Former Smoker    Packs/day: 0.50    Years: 20.00    Pack years: 10.00    Types: Cigarettes    Last attempt to quit: 04/19/2017    Years since quitting: 1.9  . Smokeless tobacco: Never Used  . Tobacco comment: per pt quit cold [place] 04-19-2017  Substance Use Topics  . Alcohol use: Yes    Comment: occasional  . Drug use: No  Allergies   Patient has no known allergies.   Review of Systems Review of Systems  Constitutional: Negative for fever.  HENT: Negative for sore throat.   Respiratory: Negative for cough and shortness of breath.   Cardiovascular: Negative for chest pain.  Gastrointestinal: Positive for abdominal pain. Negative for constipation, diarrhea, flatus and vomiting.  All other systems reviewed and are negative.    Physical Exam Updated Vital Signs BP 122/81   Pulse (!) 56   Temp 98.3 F (36.8 C) (Oral)   Resp 14   Ht 5\' 7"  (1.702 m)   Wt 93 kg   SpO2 99%   BMI  32.11 kg/m   Physical Exam Vitals signs and nursing note reviewed.  Constitutional:      Appearance: He is obese. He is not ill-appearing.  HENT:     Head: Normocephalic and atraumatic.     Mouth/Throat:     Mouth: Mucous membranes are moist.  Eyes:     Extraocular Movements: Extraocular movements intact.     Pupils: Pupils are equal, round, and reactive to light.  Neck:     Musculoskeletal: Normal range of motion and neck supple.  Cardiovascular:     Rate and Rhythm: Normal rate and regular rhythm.     Pulses: Normal pulses.     Heart sounds: Normal heart sounds.  Pulmonary:     Effort: Pulmonary effort is normal.     Breath sounds: Normal breath sounds.  Abdominal:     Palpations: Abdomen is soft.     Tenderness: There is no abdominal tenderness. There is no guarding or rebound.  Musculoskeletal: Normal range of motion.  Skin:    General: Skin is warm and dry.     Capillary Refill: Capillary refill takes less than 2 seconds.  Neurological:     General: No focal deficit present.     Mental Status: He is alert and oriented to person, place, and time.  Psychiatric:        Mood and Affect: Mood normal.        Behavior: Behavior normal.      ED Treatments / Results  Labs (all labs ordered are listed, but only abnormal results are displayed) Results for orders placed or performed during the hospital encounter of 03/20/19  Lipase, blood  Result Value Ref Range   Lipase 81 (H) 11 - 51 U/L  Comprehensive metabolic panel  Result Value Ref Range   Sodium 136 135 - 145 mmol/L   Potassium 4.5 3.5 - 5.1 mmol/L   Chloride 105 98 - 111 mmol/L   CO2 23 22 - 32 mmol/L   Glucose, Bld 116 (H) 70 - 99 mg/dL   BUN 20 6 - 20 mg/dL   Creatinine, Ser 1.48 (H) 0.61 - 1.24 mg/dL   Calcium 9.5 8.9 - 10.3 mg/dL   Total Protein 6.6 6.5 - 8.1 g/dL   Albumin 4.2 3.5 - 5.0 g/dL   AST 26 15 - 41 U/L   ALT 24 0 - 44 U/L   Alkaline Phosphatase 41 38 - 126 U/L   Total Bilirubin 1.0 0.3 -  1.2 mg/dL   GFR calc non Af Amer 51 (L) >60 mL/min   GFR calc Af Amer 60 (L) >60 mL/min   Anion gap 8 5 - 15  CBC  Result Value Ref Range   WBC 13.3 (H) 4.0 - 10.5 K/uL   RBC 5.09 4.22 - 5.81 MIL/uL   Hemoglobin 14.9 13.0 - 17.0 g/dL  HCT 45.6 39.0 - 52.0 %   MCV 89.6 80.0 - 100.0 fL   MCH 29.3 26.0 - 34.0 pg   MCHC 32.7 30.0 - 36.0 g/dL   RDW 12.4 11.5 - 15.5 %   Platelets 266 150 - 400 K/uL   nRBC 0.0 0.0 - 0.2 %  Urinalysis, Routine w reflex microscopic  Result Value Ref Range   Color, Urine YELLOW YELLOW   APPearance CLEAR CLEAR   Specific Gravity, Urine 1.023 1.005 - 1.030   pH 6.0 5.0 - 8.0   Glucose, UA NEGATIVE NEGATIVE mg/dL   Hgb urine dipstick NEGATIVE NEGATIVE   Bilirubin Urine NEGATIVE NEGATIVE   Ketones, ur NEGATIVE NEGATIVE mg/dL   Protein, ur NEGATIVE NEGATIVE mg/dL   Nitrite NEGATIVE NEGATIVE   Leukocytes,Ua NEGATIVE NEGATIVE   US Abdomen Limited  Result Date: 03/21/2019 CLINICAL DATA:  57 y/o  M; right upper quadrant abdominal pain. EXAM: ULTRASOUND ABDOMEN LIMITED RIGHT UPPER QUADRANT COMPARISON:  None. FINDINGS: Gallbladder: No gallstones or wall thickening visualized. No sonographic Murphy sign noted by sonographer. Common bile duct: Diameter: 5 mm Liver: No focal lesion identified. Increased liver echogenicity. Portal vein is patent on color Doppler imaging with normal direction of blood flow towards the liver. IMPRESSION: 1. Normal appearance of the gallbladder. No biliary ductal dilatation. 2. Hepatic steatosis. Electronically Signed   By: Kristine Garbe M.D.   On: 03/21/2019 05:04    EKG None  Radiology US Abdomen Limited  Result Date: 03/21/2019 CLINICAL DATA:  58 y/o  M; right upper quadrant abdominal pain. EXAM: ULTRASOUND ABDOMEN LIMITED RIGHT UPPER QUADRANT COMPARISON:  None. FINDINGS: Gallbladder: No gallstones or wall thickening visualized. No sonographic Murphy sign noted by sonographer. Common bile duct: Diameter: 5 mm Liver: No  focal lesion identified. Increased liver echogenicity. Portal vein is patent on color Doppler imaging with normal direction of blood flow towards the liver. IMPRESSION: 1. Normal appearance of the gallbladder. No biliary ductal dilatation. 2. Hepatic steatosis. Electronically Signed   By: Kristine Garbe M.D.   On: 03/21/2019 05:04    Procedures Procedures (including critical care time)  Medications Ordered in ED Medications  sodium chloride flush (NS) 0.9 % injection 3 mL (has no administration in time range)  fentaNYL (SUBLIMAZE) injection 100 mcg (100 mcg Intravenous Given 03/21/19 0536)  ondansetron (ZOFRAN) injection 4 mg (4 mg Intravenous Given 03/21/19 0537)  alum & mag hydroxide-simeth (MAALOX/MYLANTA) 200-200-20 MG/5ML suspension 30 mL (30 mLs Oral Given 03/21/19 0537)    And  lidocaine (XYLOCAINE) 2 % viscous mouth solution 15 mL (15 mLs Oral Given 03/21/19 0537)     Treated  With percocet for pancreatitis  Final Clinical Impressions(s) / ED Diagnoses      Return for intractable cough, coughing up blood,fevers >100.4 unrelieved by medication, shortness of breath, intractable vomiting, chest pain, shortness of breath, weakness,numbness, changes in speech, facial asymmetry,abdominal pain, passing out,Inability to tolerate liquids or food, cough, altered mental status or any concerns. No signs of systemic illness or infection. The patient is nontoxic-appearing on exam and vital signs are within normal limits.   I have reviewed the triage vital signs and the nursing notes. Pertinent labs &imaging results that were available during my care of the patient were reviewed by me and considered in my medical decision making (see chart for details).  After history, exam, and medical workup I feel the patient has been appropriately medically screened and is safe for discharge home. Pertinent diagnoses were discussed with the patient. Patient was  given return precautions     ED Discharge Orders       Mariah Harn, MD 03/21/19 [phone]

## 2019-07-17 ENCOUNTER — Other Ambulatory Visit: Payer: Self-pay | Admitting: Urology

## 2019-07-27 ENCOUNTER — Encounter (HOSPITAL_BASED_OUTPATIENT_CLINIC_OR_DEPARTMENT_OTHER): Payer: Self-pay | Admitting: *Deleted

## 2019-07-27 ENCOUNTER — Other Ambulatory Visit (HOSPITAL_COMMUNITY)
Admission: RE | Admit: 2019-07-27 | Discharge: 2019-07-27 | Disposition: A | Discharge status: Home / Self Care | Payer: Managed Care, Other (non HMO) | Source: Ambulatory Visit | Attending: Urology | Admitting: Urology

## 2019-07-27 ENCOUNTER — Other Ambulatory Visit: Payer: Self-pay

## 2019-07-27 DIAGNOSIS — Z20828 Contact with and (suspected) exposure to other viral communicable diseases: Secondary | ICD-10-CM | POA: Diagnosis not present

## 2019-07-27 DIAGNOSIS — Z01812 Encounter for preprocedural laboratory examination: Secondary | ICD-10-CM | POA: Diagnosis present

## 2019-07-27 NOTE — Progress Notes (Signed)
Spoke w/ via phone for pre-op interview Oscar Church needs dos none           Church results------ COVID test - 07-27-2019 Arrive at 100 am 07-30-2019 NPO after -midnight Medications to take morning of surgery - fenofibrate Diabetic medication -----n/a Patient Special Instructions ----- Pre-Op special Istructions ----- Patient verbalized understanding of instructions that were given at this phone interview. Patient denies shortness of breath, chest pain, fever, cough a this phone interview.

## 2019-07-28 LAB — NOVEL CORONAVIRUS, NAA (HOSP ORDER, SEND-OUT TO REF LAB; TAT 18-24 HRS): SARS-CoV-2, NAA: NOT DETECTED

## 2019-07-29 NOTE — H&P (Signed)
CC: I have bladder cancer that has been treated.  HPI: Oscar Church is a 58 year-old male established patient who is here for follow-up of bladder cancer treatment.  His bladder cancer was superficial and limitied to the bladder lining. His bladder cancer was not muscle invasive.   He did have a TURBT. His last bladder tumor was resected 09/02/2018.   He has not had blood in his urine recently.   His last cysto was 11/13/2017.   Oscar Church returns today in f/u for his history of low grade non-invasive urothelial CA. He completed BCG on 11/10/18. He is doing well resolution of his LUTS and his IPSS is 2. he has no hematuria. Cytology was negative in 3/20. His PSA went up with the BCG and a biopsy in 7/20 was negative.   He had a TURBT on 11/28/17 for possible recurrence. His path just showed chronic inflammation. He had an initial TURBT on 04/30/17. He had several abnormal lesions in the bladder with biopsies from the posterior wall and bladder neck showing atypia and a lesion from the LL wall being LG NMIBC. He has no complaints today and has a clear urine.     CC: AUA Questions Scoring.  HPI:   There is new hesitancy     AUA Symptom Score: He never has the sensation of not emptying his bladder completely after finishing urinating. He never has to urinate again less that two hours after he has finished urinating. Less than 20% of the time he has to start and stop again several times when he urinates. He never finds it difficult to postpone urination. He never has a weak urinary stream. He never has to push or strain to begin urination. He has to get up to urinate 1 time from the time he goes to bed until the time he gets up in the morning.   Calculated AUA Symptom Score: 2    ALLERGIES: None   MEDICATIONS: Simvastatin 20 mg tablet  Fenofibrate  Vitamin C     GU PSH: Bladder Instill AntiCA Agent - 11/10/2018, 11/03/2018, 10/20/2018, 10/13/2018, 10/06/2018, 09/29/2018 Cystoscopy - 04/20/2019,  01/14/2019, 08/22/2018, 2019, 08/07/2017, 2018 Cystoscopy Fulguration - 09/02/2018, 2018 Cystoscopy TURBT 2-5 cm - 2019, 2018 Locm 300-399Mg /Ml Iodine,1Ml - 2018 Prostate Needle Biopsy - 04/29/2019     NON-GU PSH: Surgical Pathology, Gross And Microscopic Examination For Prostate Needle - 04/29/2019     GU PMH: Elevated PSA - 04/29/2019, - 04/20/2019 (Worsening), PSA is up to 4.3 but he finished BCG in 1/20. I will repeat the PSA and do an exam in 3 months. , - 01/14/2019 (Improving), His PSA has fallen further. He will need a repeat in a year. , - 08/22/2018, His PSA has been falling over the last several months so I will continue to watch it with a repeat in 6 months. , - 2019, He reports that Dr. Nancy Fetter told him his PSA is up. I have requested that result and will repeat the PSA with a week of abstinence in 3 months. The elevation is probably from his recent instrumentation., - 08/07/2017 BPH w/o LUTS, He is voiding well and his exam remains benign but the PSA is up further. I believe we need to do a prostate [place] and biopsy. 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 He is going to need induction BCG with 1 year of maintenance. I will repeat a PSA prior to  his next cystoscopy in about 4 months. BCG procedure and side effects reviewed in detail. - 09/15/2018 History of bladder cancer, His repeat resection was benign. He will return in 6 months for cystoscopy. - 2019, He has persistent post op edema and inflammation particularly on the left with associated urgency and dysuria. I am going to given him Uribel and will repeat cystoscopy in 3 months. If the lesion persists at that time, he will need reresection. , - 08/07/2017 Bladder Cancer overlapping sites (Worsening), He has a possible recurrence on the left trigone and BN. Urine cytology today. I will set him up for a TURBT. I have reviewed the risks including  bleeding, infection, ureteral and bladder injury, need for secondary procedures, thrombotic events and anesthetic complications. - 2019, He had LG NMIBC with patchy areas of atypia vs reactive disease. I am going to have him return in 3 months for cystoscopy with a cytology prior to the visit. I don't see a need for BCG at this time. , - 2018 Urinary Urgency, I am going to give him Oxybutynin ER 5mg  daily. I have reviewed the side effects. - 08/07/2017 Bladder tumor/neoplasm, He has a bladder lesion on the right bladder base that is worrisome for CIS. I will get a cytology today and get him set up for a cystoscopy with biopsy and fulguration. Risks of bleeding, infection, bladder injury, thrombotic events and anesthetic complications reviewed. - 2018 Gross hematuria - 2018, He had single episode of gross hematuria with a negative CT. He will be set up to return for cystoscopy., - 2018    NON-GU PMH: Hypercholesterolemia    FAMILY HISTORY: 2 daughters - Other 1 son - Other nephrolithiasis - No Family History   SOCIAL HISTORY: Marital Status: Married Preferred Language: English; Race: White Current Smoking Status: Patient smokes.   Tobacco Use Assessment Completed: Used Tobacco in last 30 days? Drinks 2 caffeinated drinks per day.    REVIEW OF SYSTEMS:    GU Review Male:   Patient denies frequent urination, hard to postpone urination, burning/ pain with urination, get up at night to urinate, leakage of urine, stream starts and stops, trouble starting your stream, have to strain to urinate , erection problems, and penile pain.  Gastrointestinal (Upper):   Patient denies nausea, vomiting, and indigestion/ heartburn.  Gastrointestinal (Lower):   Patient denies constipation and diarrhea.  Constitutional:   Patient denies fever, night sweats, weight loss, and fatigue.  Skin:   Patient denies skin rash/ lesion and itching.  Eyes:   Patient denies blurred vision and double vision.  Ears/ Nose/  Throat:   Patient denies sore throat and sinus problems.  Hematologic/Lymphatic:   Patient denies swollen glands and easy bruising.  Cardiovascular:   Patient denies leg swelling and chest pains.  Respiratory:   Patient denies cough and shortness of breath.  Endocrine:   Patient denies excessive thirst.  Musculoskeletal:   Patient denies back pain and joint pain.  Neurological:   Patient denies headaches and dizziness.  Psychologic:   Patient denies depression and anxiety.   VITAL SIGNS:      07/17/2019 07:50 AM  BP 116/77 mmHg  Pulse 59 /min  Temperature 97.5 F / 36.3 C   MULTI-SYSTEM PHYSICAL EXAMINATION:    Constitutional: Well-nourished. No physical deformities. Normally developed. Good grooming.  Respiratory: Normal breath sounds. No labored breathing, no use of accessory muscles.   Cardiovascular: Regular rate and rhythm. No murmur, no gallop.      PAST DATA  REVIEWED:  Source Of History:  Patient  Urine Test Review:   Urinalysis   04/16/19 01/07/19 08/14/18 11/05/17 07/23/17 04/22/17  PSA  Total PSA 6.94 ng/mL 4.30 ng/mL 2.73 ng/mL 3.51 ng/mL 3.96 ng/dl 4.03 ng/dl  Free PSA 0.93 ng/mL 0.83 ng/mL      % Free PSA 13 % PSA 19 % PSA        PROCEDURES:         Flexible Cystoscopy - 52000  Risks, benefits, and some of the potential complications of the procedure were discussed. 66ml of 2% lidocaine jelly was instilled intraurethrally.  Cipro 500mg  given for antibiotic prophylaxis.     Meatus:  Normal size. Normal location. Normal condition.  Urethra:  No strictures.  External Sphincter:  Normal.  Verumontanum:  Normal.  Prostate:  Obstructing. Moderate hyperplasia.  Bladder Neck:  Non-obstructing.  Ureteral Orifices:  Normal location. Normal size. Normal shape. Effluxed clear urine.  Bladder:  Mild trabeculation. Erythematous mucosa about 1cm on the posterior wall. No tumors. No stones.      The procedure was well tolerated and there were no complications.          Urinalysis Dipstick Dipstick Cont'd  Color: Yellow Bilirubin: Neg mg/dL  Appearance: Clear Ketones: Neg mg/dL  Specific Gravity: 1.015 Blood: Neg ery/uL  pH: 6.5 Protein: Neg mg/dL  Glucose: Neg mg/dL Urobilinogen: 0.2 mg/dL    Nitrites: Neg    Leukocyte Esterase: Neg leu/uL    ASSESSMENT:      ICD-10 Details  1 GU:   History of bladder cancer - Z85.51 He has a small lesion on the posterior wall that could be inflammatory or CIS. I am going to get him set up for cystoscopy, bil RTG's and biopsy and fulguration. I reviewed the risk of bleeding, infection, bladder injury, strictures, thrombotic events and anesthetic complications.   2   Bladder tumor/neoplasm - D41.4   3   BPH w/LUTS - N40.1 He has mild LUTS with some intermittency.   4   Urinary Hesitancy - R39.11    PLAN:           Orders Labs Urine Cytology          Schedule Return Visit/Planned Activity: Next Available Appointment - Schedule Surgery

## 2019-07-30 ENCOUNTER — Encounter (HOSPITAL_BASED_OUTPATIENT_CLINIC_OR_DEPARTMENT_OTHER): Payer: Self-pay | Admitting: *Deleted

## 2019-07-30 ENCOUNTER — Other Ambulatory Visit: Payer: Self-pay

## 2019-07-30 ENCOUNTER — Ambulatory Visit (HOSPITAL_BASED_OUTPATIENT_CLINIC_OR_DEPARTMENT_OTHER)
Admission: RE | Admit: 2019-07-30 | Discharge: 2019-07-30 | Disposition: A | Discharge status: Home / Self Care | Payer: Managed Care, Other (non HMO) | Attending: Urology | Admitting: Urology

## 2019-07-30 ENCOUNTER — Ambulatory Visit (HOSPITAL_BASED_OUTPATIENT_CLINIC_OR_DEPARTMENT_OTHER): Payer: Managed Care, Other (non HMO) | Admitting: Anesthesiology

## 2019-07-30 ENCOUNTER — Encounter (HOSPITAL_BASED_OUTPATIENT_CLINIC_OR_DEPARTMENT_OTHER)
Admission: RE | Disposition: A | Discharge status: Home / Self Care | Payer: Self-pay | Source: Home / Self Care | Attending: Urology

## 2019-07-30 DIAGNOSIS — E7800 Pure hypercholesterolemia, unspecified: Secondary | ICD-10-CM | POA: Insufficient documentation

## 2019-07-30 DIAGNOSIS — Z8551 Personal history of malignant neoplasm of bladder: Secondary | ICD-10-CM | POA: Insufficient documentation

## 2019-07-30 DIAGNOSIS — Z79899 Other long term (current) drug therapy: Secondary | ICD-10-CM | POA: Insufficient documentation

## 2019-07-30 DIAGNOSIS — Z87891 Personal history of nicotine dependence: Secondary | ICD-10-CM | POA: Insufficient documentation

## 2019-07-30 DIAGNOSIS — N401 Enlarged prostate with lower urinary tract symptoms: Secondary | ICD-10-CM | POA: Diagnosis not present

## 2019-07-30 DIAGNOSIS — R3911 Hesitancy of micturition: Secondary | ICD-10-CM | POA: Diagnosis not present

## 2019-07-30 DIAGNOSIS — N329 Bladder disorder, unspecified: Secondary | ICD-10-CM | POA: Insufficient documentation

## 2019-07-30 HISTORY — PX: CYSTOSCOPY WITH BIOPSY: SHX5122

## 2019-07-30 SURGERY — CYSTOSCOPY, WITH BIOPSY
Anesthesia: General | Laterality: Bilateral

## 2019-07-30 MED ORDER — ONDANSETRON HCL 4 MG/2ML IJ SOLN
INTRAMUSCULAR | Status: DC | PRN
  Administered 2019-07-30: 4 mg via INTRAVENOUS

## 2019-07-30 MED ORDER — FENTANYL CITRATE (PF) 100 MCG/2ML IJ SOLN
INTRAMUSCULAR | Status: AC
  Filled 2019-07-30: qty 2

## 2019-07-30 MED ORDER — MEPERIDINE HCL 25 MG/ML IJ SOLN
6.2500 mg | INTRAMUSCULAR | Status: DC | PRN
  Filled 2019-07-30: qty 1

## 2019-07-30 MED ORDER — ACETAMINOPHEN 650 MG RE SUPP
650.0000 mg | RECTAL | Status: DC | PRN
  Filled 2019-07-30: qty 1

## 2019-07-30 MED ORDER — ACETAMINOPHEN 325 MG PO TABS
650.0000 mg | ORAL_TABLET | ORAL | Status: DC | PRN
  Filled 2019-07-30: qty 2

## 2019-07-30 MED ORDER — LIDOCAINE 2% (20 MG/ML) 5 ML SYRINGE
INTRAMUSCULAR | Status: AC
  Filled 2019-07-30: qty 5

## 2019-07-30 MED ORDER — CEFAZOLIN SODIUM-DEXTROSE 2-4 GM/100ML-% IV SOLN
2.0000 g | INTRAVENOUS | Status: AC
  Administered 2019-07-30: 2 g via INTRAVENOUS
  Filled 2019-07-30: qty 100

## 2019-07-30 MED ORDER — PROMETHAZINE HCL 25 MG/ML IJ SOLN
6.2500 mg | INTRAMUSCULAR | Status: DC | PRN
  Filled 2019-07-30: qty 1

## 2019-07-30 MED ORDER — OXYCODONE HCL 5 MG PO TABS
5.0000 mg | ORAL_TABLET | Freq: Once | ORAL | Status: DC | PRN
  Filled 2019-07-30: qty 1

## 2019-07-30 MED ORDER — CEFAZOLIN SODIUM-DEXTROSE 2-4 GM/100ML-% IV SOLN
INTRAVENOUS | Status: AC
  Filled 2019-07-30: qty 100

## 2019-07-30 MED ORDER — PROPOFOL 10 MG/ML IV BOLUS
INTRAVENOUS | Status: DC | PRN
  Administered 2019-07-30: 100 mg via INTRAVENOUS
  Administered 2019-07-30: 20 mg via INTRAVENOUS
  Administered 2019-07-30: 30 mg via INTRAVENOUS

## 2019-07-30 MED ORDER — SODIUM CHLORIDE 0.9% FLUSH
3.0000 mL | Freq: Two times a day (BID) | INTRAVENOUS | Status: DC
  Filled 2019-07-30: qty 3

## 2019-07-30 MED ORDER — FENTANYL CITRATE (PF) 100 MCG/2ML IJ SOLN
25.0000 ug | INTRAMUSCULAR | Status: DC | PRN
  Filled 2019-07-30: qty 1

## 2019-07-30 MED ORDER — MIDAZOLAM HCL 5 MG/5ML IJ SOLN
INTRAMUSCULAR | Status: DC | PRN
  Administered 2019-07-30: 2 mg via INTRAVENOUS

## 2019-07-30 MED ORDER — MORPHINE SULFATE (PF) 2 MG/ML IV SOLN
2.0000 mg | INTRAVENOUS | Status: DC | PRN
  Filled 2019-07-30: qty 1

## 2019-07-30 MED ORDER — OXYCODONE HCL 5 MG/5ML PO SOLN
5.0000 mg | Freq: Once | ORAL | Status: DC | PRN
  Filled 2019-07-30: qty 5

## 2019-07-30 MED ORDER — SODIUM CHLORIDE 0.9% FLUSH
3.0000 mL | INTRAVENOUS | Status: DC | PRN
  Filled 2019-07-30: qty 3

## 2019-07-30 MED ORDER — FENTANYL CITRATE (PF) 100 MCG/2ML IJ SOLN
INTRAMUSCULAR | Status: DC | PRN
  Administered 2019-07-30 (×2): 25 ug via INTRAVENOUS
  Administered 2019-07-30: 50 ug via INTRAVENOUS

## 2019-07-30 MED ORDER — PROPOFOL 10 MG/ML IV BOLUS
INTRAVENOUS | Status: AC
  Filled 2019-07-30: qty 40

## 2019-07-30 MED ORDER — DEXAMETHASONE SODIUM PHOSPHATE 10 MG/ML IJ SOLN
INTRAMUSCULAR | Status: AC
  Filled 2019-07-30: qty 1

## 2019-07-30 MED ORDER — KETOROLAC TROMETHAMINE 30 MG/ML IJ SOLN
INTRAMUSCULAR | Status: DC | PRN
  Administered 2019-07-30: 30 mg via INTRAVENOUS

## 2019-07-30 MED ORDER — ACETAMINOPHEN 500 MG PO TABS
1000.0000 mg | ORAL_TABLET | Freq: Once | ORAL | Status: DC
  Filled 2019-07-30: qty 2

## 2019-07-30 MED ORDER — LIDOCAINE HCL (CARDIAC) PF 100 MG/5ML IV SOSY
PREFILLED_SYRINGE | INTRAVENOUS | Status: DC | PRN
  Administered 2019-07-30: 80 mg via INTRAVENOUS

## 2019-07-30 MED ORDER — KETOROLAC TROMETHAMINE 30 MG/ML IJ SOLN
INTRAMUSCULAR | Status: AC
  Filled 2019-07-30: qty 1

## 2019-07-30 MED ORDER — LACTATED RINGERS IV SOLN
INTRAVENOUS | Status: DC
  Administered 2019-07-30 (×2): via INTRAVENOUS
  Filled 2019-07-30: qty 1000

## 2019-07-30 MED ORDER — MIDAZOLAM HCL 2 MG/2ML IJ SOLN
INTRAMUSCULAR | Status: AC
  Filled 2019-07-30: qty 2

## 2019-07-30 MED ORDER — OXYCODONE HCL 5 MG PO TABS
5.0000 mg | ORAL_TABLET | ORAL | Status: DC | PRN
  Filled 2019-07-30: qty 2

## 2019-07-30 MED ORDER — SODIUM CHLORIDE 0.9 % IV SOLN
250.0000 mL | INTRAVENOUS | Status: DC | PRN
  Filled 2019-07-30: qty 250

## 2019-07-30 MED ORDER — ONDANSETRON HCL 4 MG/2ML IJ SOLN
INTRAMUSCULAR | Status: AC
  Filled 2019-07-30: qty 2

## 2019-07-30 MED ORDER — DEXAMETHASONE SODIUM PHOSPHATE 4 MG/ML IJ SOLN
INTRAMUSCULAR | Status: DC | PRN
  Administered 2019-07-30: 10 mg via INTRAVENOUS

## 2019-07-30 MED ORDER — IOHEXOL 300 MG/ML  SOLN
INTRAMUSCULAR | Status: DC | PRN
  Administered 2019-07-30: 12:00:00 10 mL via URETHRAL

## 2019-07-30 SURGICAL SUPPLY — 23 items
BAG DRAIN URO-CYSTO SKYTR STRL (DRAIN) ×2 IMPLANT
CATH FOLEY 2WAY SLVR  5CC 16FR (CATHETERS)
CATH FOLEY 2WAY SLVR 5CC 16FR (CATHETERS) IMPLANT
CLOTH BEACON ORANGE TIMEOUT ST (SAFETY) ×2 IMPLANT
ELECT REM PT RETURN 9FT ADLT (ELECTROSURGICAL) ×2
ELECTRODE REM PT RTRN 9FT ADLT (ELECTROSURGICAL) ×1 IMPLANT
GLOVE BIOGEL PI IND STRL 7.0 (GLOVE) ×2 IMPLANT
GLOVE BIOGEL PI INDICATOR 7.0 (GLOVE) ×2
GLOVE SURG SS PI 8.0 STRL IVOR (GLOVE) ×2 IMPLANT
GOWN STRL REUS W/ TWL LRG LVL3 (GOWN DISPOSABLE) IMPLANT
GOWN STRL REUS W/ TWL XL LVL3 (GOWN DISPOSABLE) ×2 IMPLANT
GOWN STRL REUS W/TWL LRG LVL3 (GOWN DISPOSABLE)
GOWN STRL REUS W/TWL XL LVL3 (GOWN DISPOSABLE) ×2
KIT TURNOVER CYSTO (KITS) ×2 IMPLANT
MANIFOLD NEPTUNE II (INSTRUMENTS) ×2 IMPLANT
NDL SAFETY ECLIPSE 18X1.5 (NEEDLE) IMPLANT
NEEDLE HYPO 18GX1.5 SHARP (NEEDLE)
NEEDLE HYPO 22GX1.5 SAFETY (NEEDLE) IMPLANT
NS IRRIG 500ML POUR BTL (IV SOLUTION) IMPLANT
PACK CYSTO (CUSTOM PROCEDURE TRAY) ×2 IMPLANT
SYR 20CC LL (SYRINGE) IMPLANT
TUBE CONNECTING 12X1/4 (SUCTIONS) ×2 IMPLANT
WATER STERILE IRR 3000ML UROMA (IV SOLUTION) ×2 IMPLANT

## 2019-07-30 NOTE — Anesthesia Procedure Notes (Signed)
Procedure Name: LMA Insertion Date/Time: 07/30/2019 11:28 AM Performed by: Justice Rocher, CRNA Pre-anesthesia Checklist: Patient identified, Emergency Drugs available, Suction available and Patient being monitored Patient Re-evaluated:Patient Re-evaluated prior to induction Oxygen Delivery Method: Circle system utilized Preoxygenation: Pre-oxygenation with 100% oxygen Induction Type: IV induction Ventilation: Mask ventilation without difficulty LMA: LMA inserted LMA Size: 4.0 Number of attempts: 1 Airway Equipment and Method: Bite block Placement Confirmation: positive ETCO2 and breath sounds checked- equal and bilateral Tube secured with: Tape Dental Injury: Teeth and Oropharynx as per pre-operative assessment

## 2019-07-30 NOTE — Interval H&P Note (Signed)
He reports no change in his symptoms or medical history.

## 2019-07-30 NOTE — Anesthesia Postprocedure Evaluation (Signed)
Anesthesia Post Note  Patient: Oscar Church  Procedure(s) Performed: CYSTOSCOPY BILATERAL RETROGRADES  WITH BIOPSY AND FULGURATION (Bilateral )     Patient location during evaluation: PACU Anesthesia Type: General Level of consciousness: awake and alert, oriented and patient cooperative Pain management: pain level controlled Vital Signs Assessment: post-procedure vital signs reviewed and stable Respiratory status: spontaneous breathing, nonlabored ventilation and respiratory function stable Cardiovascular status: blood pressure returned to baseline and stable Postop Assessment: no apparent nausea or vomiting Anesthetic complications: no    Last Vitals:  Vitals:   07/30/19 1215 07/30/19 1230  BP: 103/71 102/79  Pulse: (!) 53 (!) 51  Resp: (!) 8 14  Temp:    SpO2: 100% 94%    Last Pain:  Vitals:   07/30/19 1215  TempSrc:   PainSc: 0-No pain                 Pervis Hocking

## 2019-07-30 NOTE — Op Note (Signed)
Procedure: 1.  Cystoscopy with biopsy and fulguration of 5 mm posterior wall lesion. 2.  Biopsy and fulguration of 2 mm dome lesion. 3.  Bilateral retrograde pyelography with interpretation.  Preop diagnosis: History of bladder cancer with possible posterior wall recurrence.  Postop diagnosis: Same with small lesion on the dome as well.  Surgeon: Dr. Irine Seal.  Anesthesia: General.  Specimen: Bladder biopsy from posterior wall.  EBL: None.  Drain: None.  Complications: None.  Indications: The patient is a 58 year old male with a history of urothelial carcinoma the bladder was found on recent surveillance cystoscopy to have a lesion on the posterior wall approximately 5 mm in size that is suspicious for recurrence.  He is undergoes biopsy and fulguration and bilateral retrograde pyelography.  Procedure: He was given 2 g of Ancef.  A general anesthetic was induced.  He was placed in lithotomy position and fitted with PAS hose.  His perineum and genitalia were prepped with Betadine solution.  He was draped in usual sterile fashion.  Cystoscopy was performed using a 23 [place] scope and 30 degree lens.  Examination revealed a normal urethra.  The external sphincter was intact.  The prostatic urethra was approximately 2 to 3 cm in length with trilobar hyperplasia and a small middle lobe.  Examination of bladder revealed mild trabeculation.  There was a 5 mm erythematous lesion on the posterior wall to the left of midline that is suspicious for recurrent urothelial neoplasm possibly carcinoma in situ.  There is also a 2 mm lesion on the dome which could be a small recurrence as well.  The right ureteral orifice is unremarkable.  The left ureteral orifice had evidence of prior resection with slight lateral displacement but a patent meatus.  Bilateral retrograde pyelography was performed using a 5 [place] open-ended catheter and Omnipaque.  The right retrograde pyelogram demonstrated a normal  ureter and intrarenal collecting system.  The left retrograde pyelogram demonstrated a normal ureter and intrarenal collecting system.  After completion of retrograde pyelography, a cup biopsy forceps was used to biopsy the lesion on the posterior wall.  The biopsy site was then generously fulgurated including surrounding mucosa.  Additionally the small lesion on the dome was fulgurated.  Final inspection revealed no active bleeding, no bladder wall perforation and no additional lesions.  The bladder was drained and the cystoscope was removed.  He was taken down from lithotomy position, his anesthetic was reversed and he was moved to recovery in stable condition.  There were no complications.  I did not feel gemcitabine instillation was indicated.

## 2019-07-30 NOTE — Transfer of Care (Signed)
Immediate Anesthesia Transfer of Care Note  Patient: Oscar Church  Procedure(s) Performed: Procedure(s) (LRB): CYSTOSCOPY BILATERAL RETROGRADES  WITH BIOPSY AND FULGURATION (Bilateral)  Patient Location: PACU  Anesthesia Type: General  Level of Consciousness: awake, sedated, patient cooperative and responds to stimulation  Airway & Oxygen Therapy: Patient Spontanous Breathing and Patient connected to [place] 02 and soft face mask   Post-op Assessment: Report given to PACU RN, Post -op Vital signs reviewed and stable and Patient moving all extremities  Post vital signs: Reviewed and stable  Complications: No apparent anesthesia complications

## 2019-07-30 NOTE — Anesthesia Preprocedure Evaluation (Signed)
Anesthesia Evaluation  Patient identified by MRN, date of birth, ID band Patient awake    Reviewed: Allergy & Precautions, NPO status , Patient's Chart, lab work & pertinent test results  Airway Mallampati: II  TM Distance: >3 FB Neck ROM: Full    Dental no notable dental hx. (+) Dental Advisory Given   Pulmonary former smoker,  Quit smoking 2018, 10 pack year history   Pulmonary exam normal breath sounds clear to auscultation       Cardiovascular negative cardio ROS Normal cardiovascular exam Rhythm:Regular Rate:Normal     Neuro/Psych negative neurological ROS  negative psych ROS   GI/Hepatic (+)     substance abuse  alcohol use, Hx pancreatitis June 2020   Endo/Other  negative endocrine ROS  Renal/GU negative Renal ROS Bladder dysfunction  Bladder ca s/p TURBT 08/2018, last cysto 10/2017    Musculoskeletal negative musculoskeletal ROS (+)   Abdominal Normal abdominal exam  (+)   Peds negative pediatric ROS (+)  Hematology negative hematology ROS (+)   Anesthesia Other Findings   Reproductive/Obstetrics negative OB ROS                             Anesthesia Physical  Anesthesia Plan  ASA: II  Anesthesia Plan: General   Post-op Pain Management:    Induction: Intravenous  PONV Risk Score and Plan: 2 and Ondansetron, Dexamethasone, Treatment may vary due to age or medical condition and Midazolam  Airway Management Planned: LMA  Additional Equipment: None  Intra-op Plan:   Post-operative Plan: Extubation in OR  Informed Consent: I have reviewed the patients History and Physical, chart, labs and discussed the procedure including the risks, benefits and alternatives for the proposed anesthesia with the patient or authorized representative who has indicated his/her understanding and acceptance.     Dental advisory given  Plan Discussed with: CRNA  Anesthesia Plan  Comments:         Anesthesia Quick Evaluation

## 2019-07-30 NOTE — Discharge Instructions (Addendum)
CYSTOSCOPY HOME CARE INSTRUCTIONS  Activity: Rest for the remainder of the day.  Do not drive or operate equipment today.  You may resume normal activities in one to two days as instructed by your physician.   Meals: Drink plenty of liquids and eat light foods such as gelatin or soup this evening.  You may return to a normal meal plan tomorrow.  Return to Work: You may return to work in one to two days or as instructed by your physician.  Special Instructions / Symptoms: Call your physician if any of these symptoms occur:   -persistent or heavy bleeding  -bleeding which continues after first few urination  -large blood clots that are difficult to pass  -urine stream diminishes or stops completely  -fever equal to or higher than 101 degrees Farenheit.  -cloudy urine with a strong, foul odor  -severe pain  Females should always wipe from front to back after elimination.  You may feel some burning pain when you urinate.  This should disappear with time.  Applying moist heat to the lower abdomen or a hot tub bath may help relieve the pain. \    Patient Signature:  ________________________________________________________  Nurse's Signature:  ________________________________________________________   Post Anesthesia Home Care Instructions  Activity: Get plenty of rest for the remainder of the day. A responsible individual must stay with you for 24 hours following the procedure.  For the next 24 hours, DO NOT: -Drive a car -Operate machinery -Drink alcoholic beverages -Take any medication unless instructed by your physician -Make any legal decisions or sign important papers.  Meals: Start with liquid foods such as gelatin or soup. Progress to regular foods as tolerated. Avoid greasy, spicy, heavy foods. If nausea and/or vomiting occur, drink only clear liquids until the nausea and/or vomiting subsides. Call your physician if vomiting continues.  Special  Instructions/Symptoms: Your throat may feel dry or sore from the anesthesia or the breathing tube placed in your throat during surgery. If this causes discomfort, gargle with warm salt water. The discomfort should disappear within 24 hours.  If you had a scopolamine patch placed behind your ear for the management of post- operative nausea and/or vomiting:  1. The medication in the patch is effective for 72 hours, after which it should be removed.  Wrap patch in a tissue and discard in the trash. Wash hands thoroughly with soap and water. 2. You may remove the patch earlier than 72 hours if you experience unpleasant side effects which may include dry mouth, dizziness or visual disturbances. 3. Avoid touching the patch. Wash your hands with soap and water after contact with the patch.     

## 2019-07-31 ENCOUNTER — Encounter (HOSPITAL_BASED_OUTPATIENT_CLINIC_OR_DEPARTMENT_OTHER): Payer: Self-pay | Admitting: Urology

## 2019-07-31 LAB — SURGICAL PATHOLOGY

## 2020-01-27 IMAGING — CT CT RENAL STONE PROTOCOL
2 of 4 series · 16 of 46 positions shown, 18 images · non-contrast
Comparison: Ultrasound abdomen 03/21/2019

CLINICAL DATA: Right upper quadrant pain. Nausea vomiting. Elevated
lipase

EXAM:
CT ABDOMEN AND PELVIS WITHOUT CONTRAST
TECHNIQUE: Multidetector CT imaging of the abdomen and pelvis was performed
following the standard protocol without IV contrast.

[Series 3: renal stone 5.0 · axial · 0.72mm/px · z∈[+774,+1209]mm · 13 of 95 slices shown, 15 images]
[im 4/95  soft-tissue]
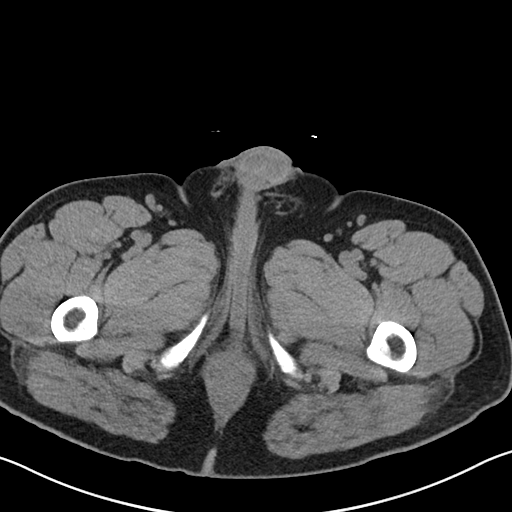
[im 4/95  bone]
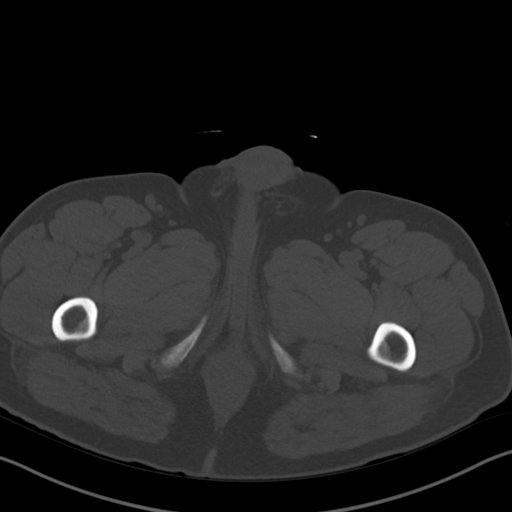
[im 12/95  soft-tissue]
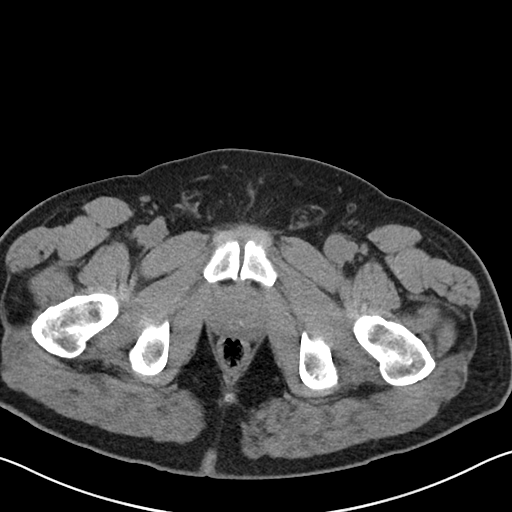
[im 19/95  soft-tissue]
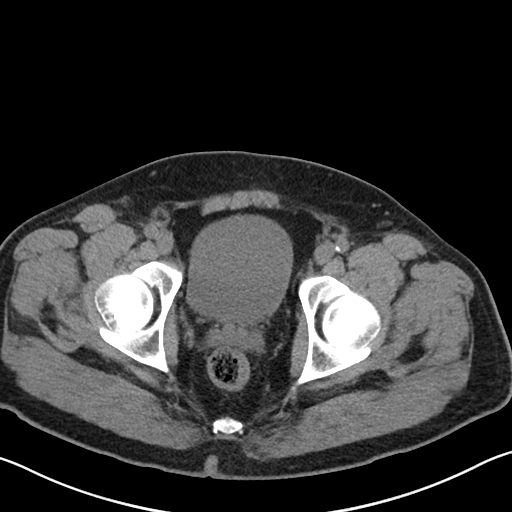
[im 27/95  soft-tissue]
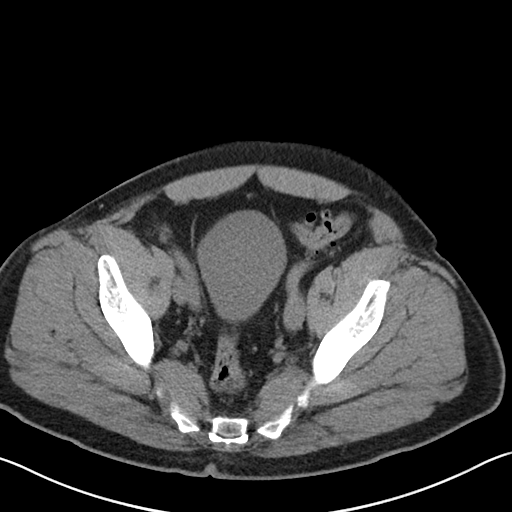
[im 34/95  soft-tissue]
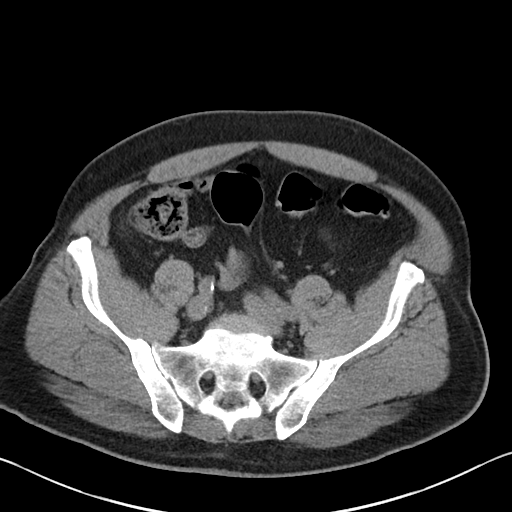
[im 42/95  soft-tissue]
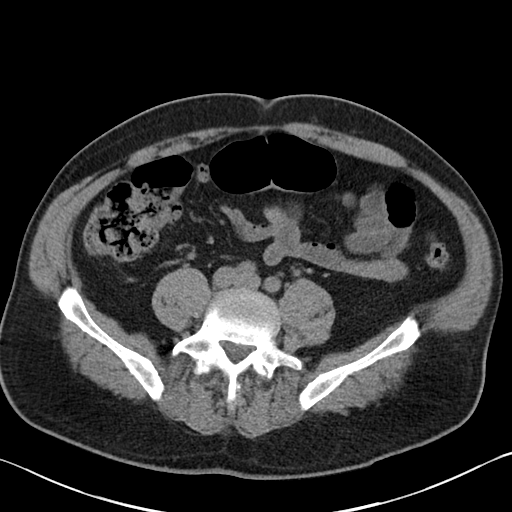
[im 49/95  soft-tissue]
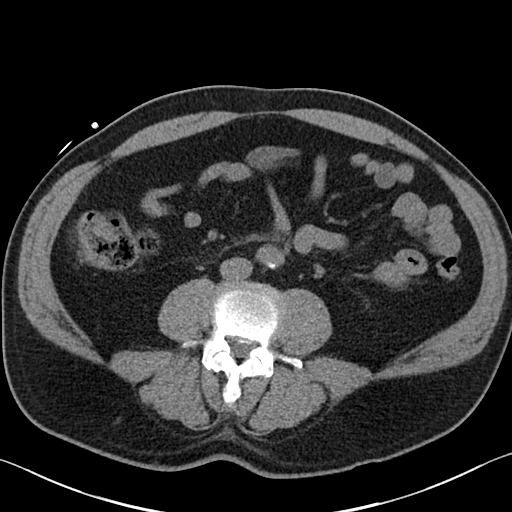
[im 53/95  soft-tissue]
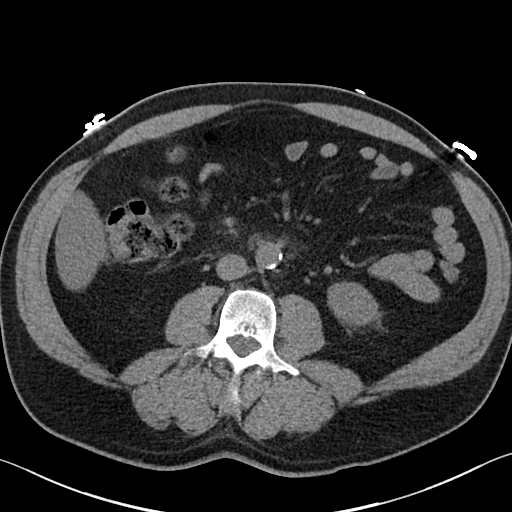
[im 61/95  soft-tissue]
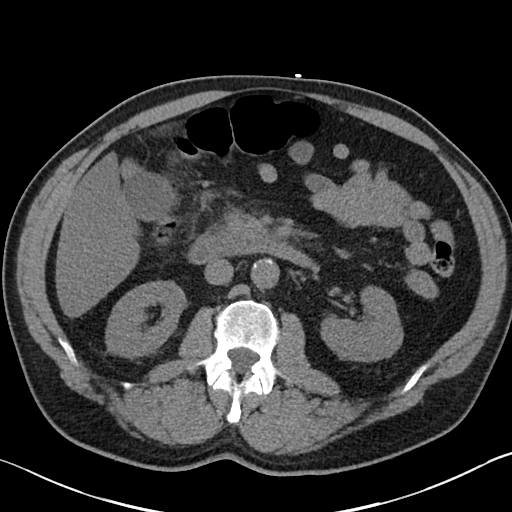
[im 61/95  bone]
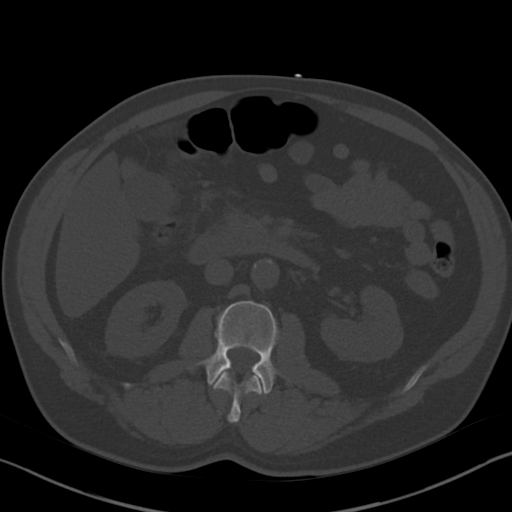
[im 68/95  soft-tissue]
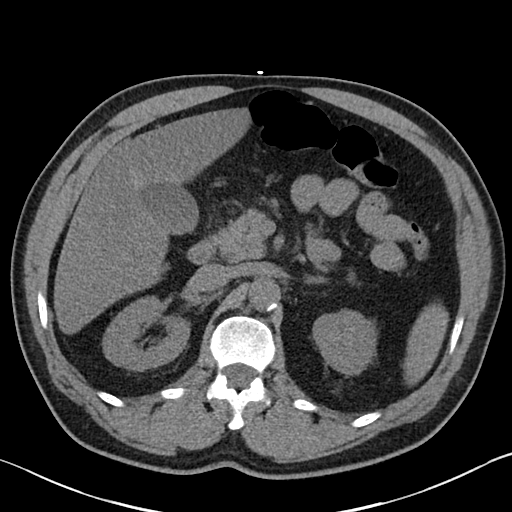
[im 76/95  soft-tissue]
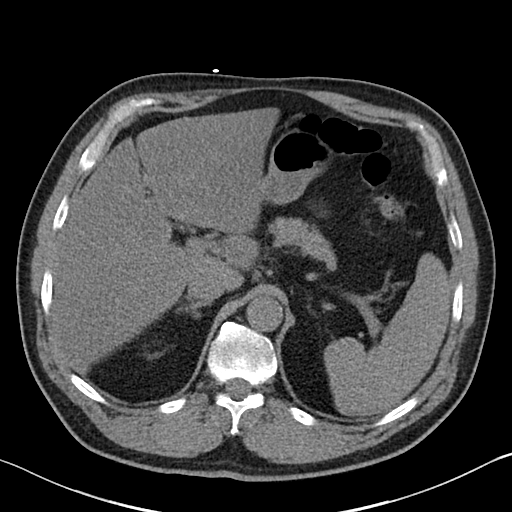
[im 83/95  soft-tissue]
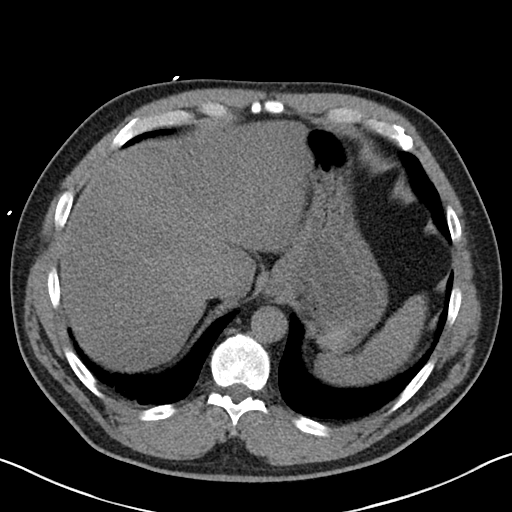
[im 91/95  soft-tissue]
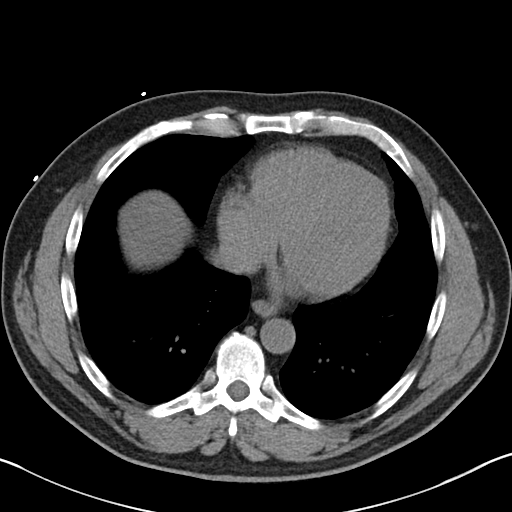

[Series 4: renal stone 3.0 cor · coronal · 0.80mm/px · 3 of 101 slices shown]
[im 34/101  soft-tissue]
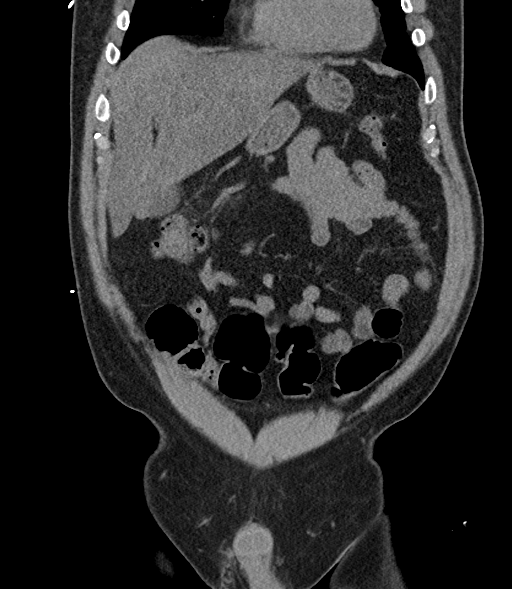
[im 45/101  soft-tissue]
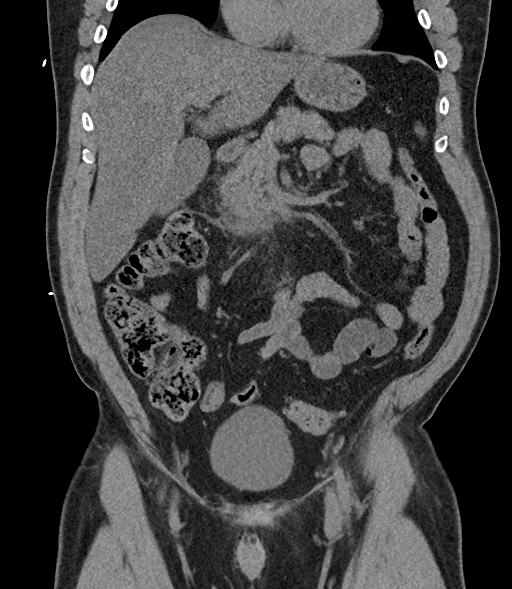
[im 56/101  soft-tissue]
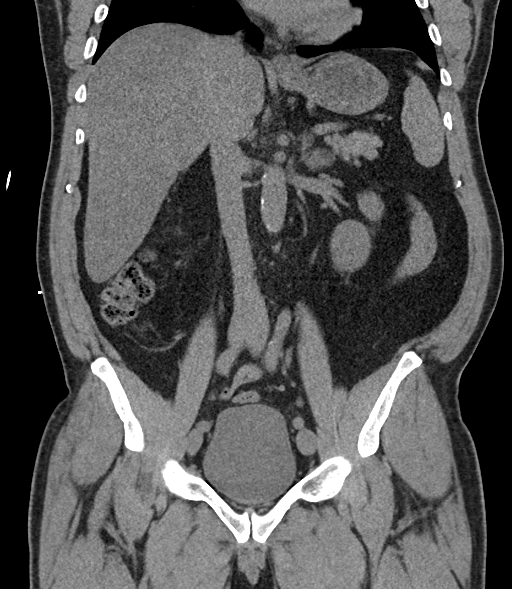

[16 of 46 positions shown; findings below may reference images not displayed]

FINDINGS: Lower chest: Lung bases clear bilaterally.

Hepatobiliary: Fatty infiltration of the liver. Gallbladder and bile
ducts normal.

Pancreas: Mild swelling of the pancreatic head with peripancreatic
edema suggestive of acute pancreatitis. No fluid collection. No
pancreatic calcifications.

Spleen: Negative

Adrenals/Urinary Tract: Adrenal glands are unremarkable. Kidneys are
normal, without renal calculi, focal lesion, or hydronephrosis.
Bladder is unremarkable.

Stomach/Bowel: Stomach is within normal limits. Appendix appears
normal. No evidence of bowel wall thickening, distention, or
inflammatory changes.

Vascular/Lymphatic: Mild atherosclerotic calcification in the aorta
without aneurysm. Negative for lymphadenopathy.

Reproductive: Moderate prostate enlargement

Other: No free fluid

Musculoskeletal: No acute abnormality.
IMPRESSION: Findings compatible with acute pancreatitis. Mild enlargement of the
pancreatic head with peripancreatic edema. Negative for fluid
collection. No ascites

Negative for urinary tract calculi

Fatty infiltration of the liver.

## 2021-01-19 IMAGING — US ULTRASOUND ABDOMEN LIMITED
1 series · 14 of 25 positions shown · non-contrast
Comparison: None.

CLINICAL DATA: 57 y/o  M; right upper quadrant abdominal pain.

EXAM:
ULTRASOUND ABDOMEN LIMITED RIGHT UPPER QUADRANT

[Series 1: ultrasound abdomen limited · 14 of 63 slices shown]
[im 1/63]
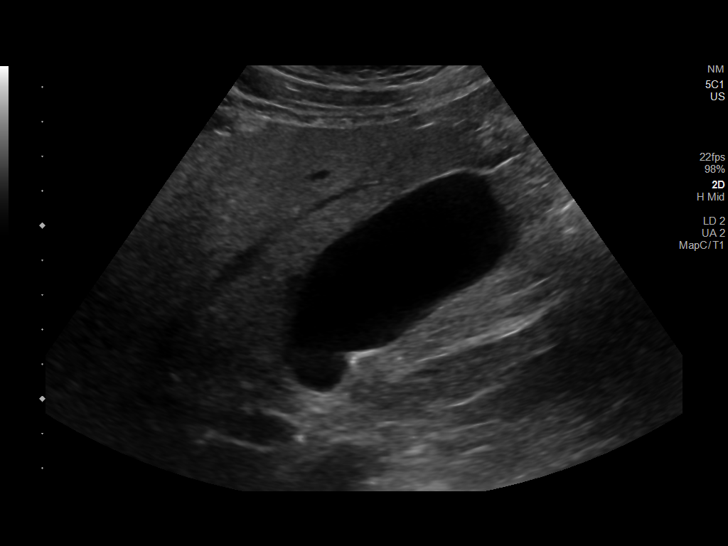
[im 6/63]
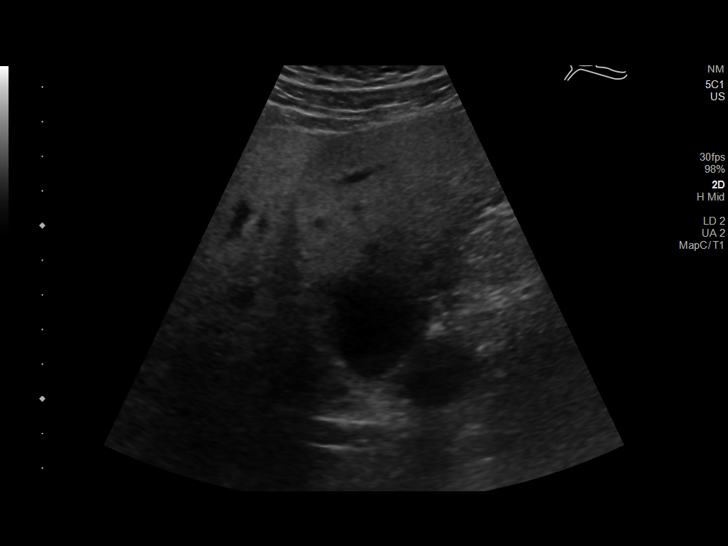
[im 11/63]
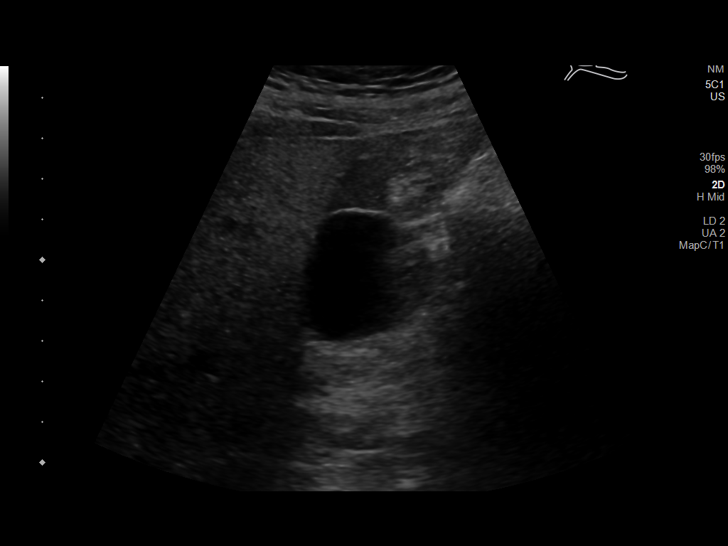
[im 16/63]
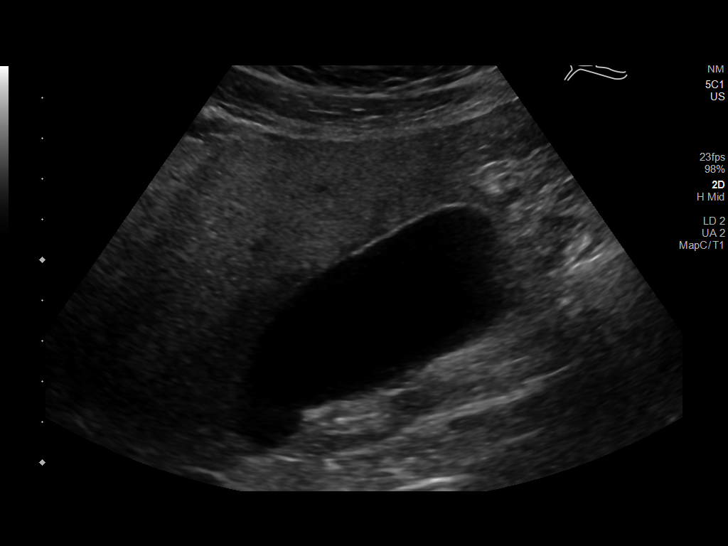
[im 21/63]
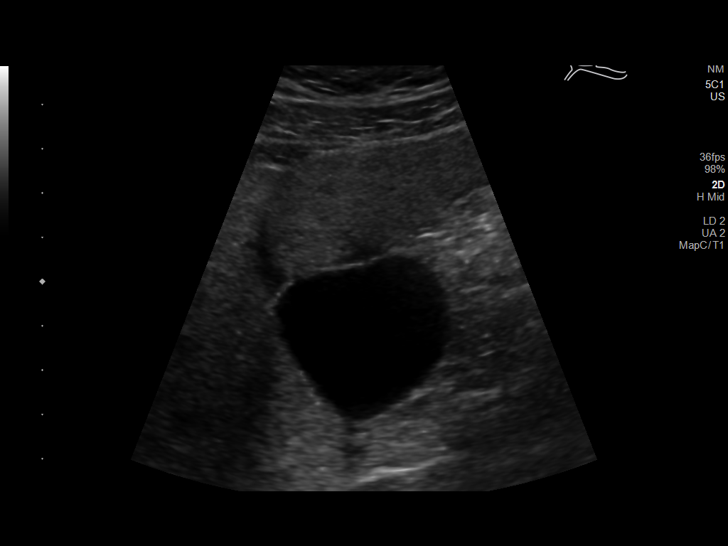
[im 24/63]
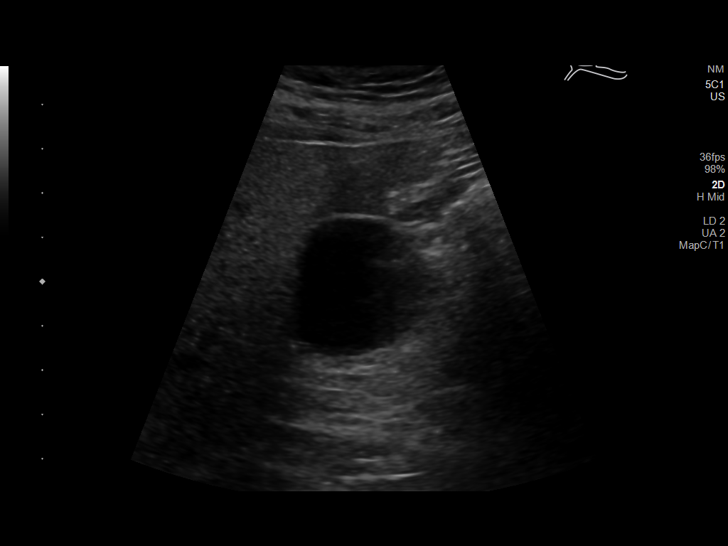
[im 29/63]
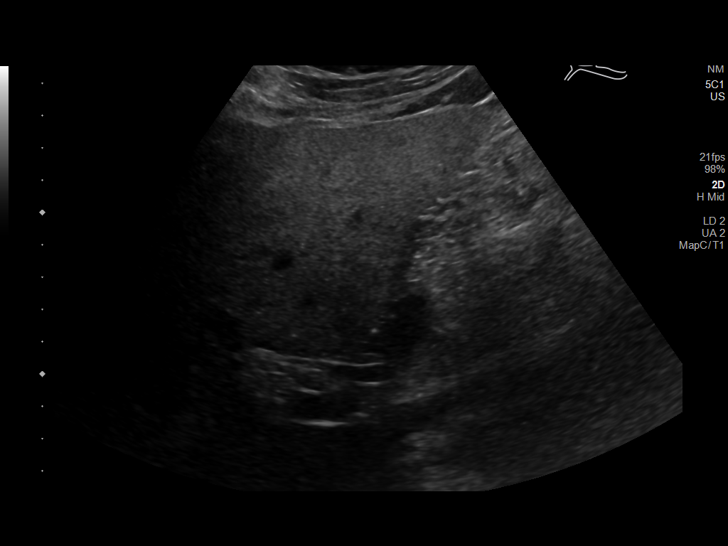
[im 34/63]
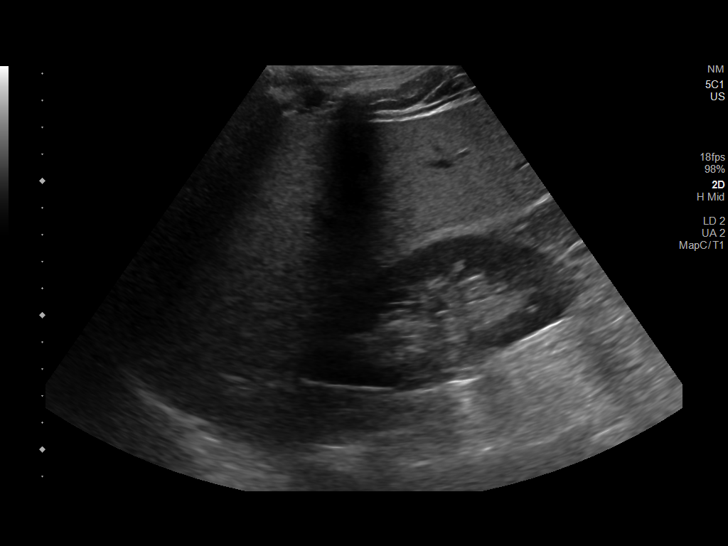
[im 39/63]
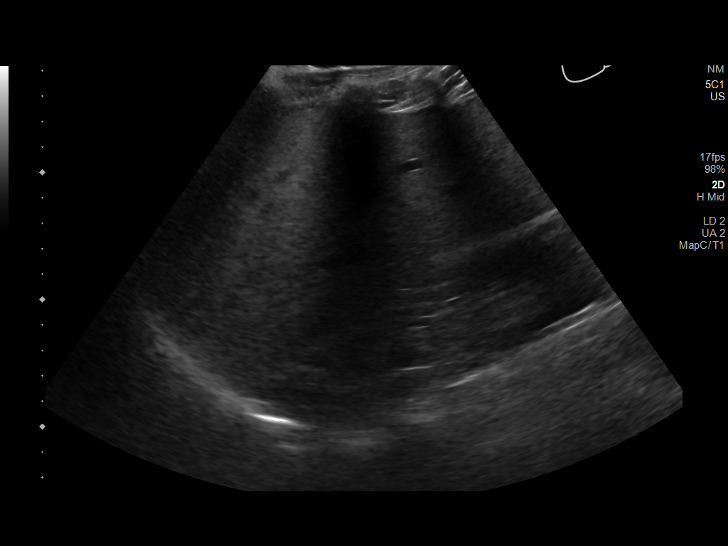
[im 42/63]
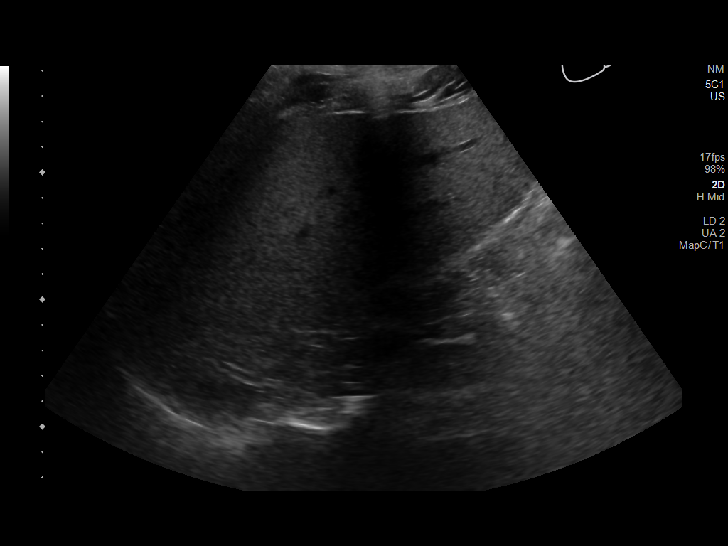
[im 47/63]
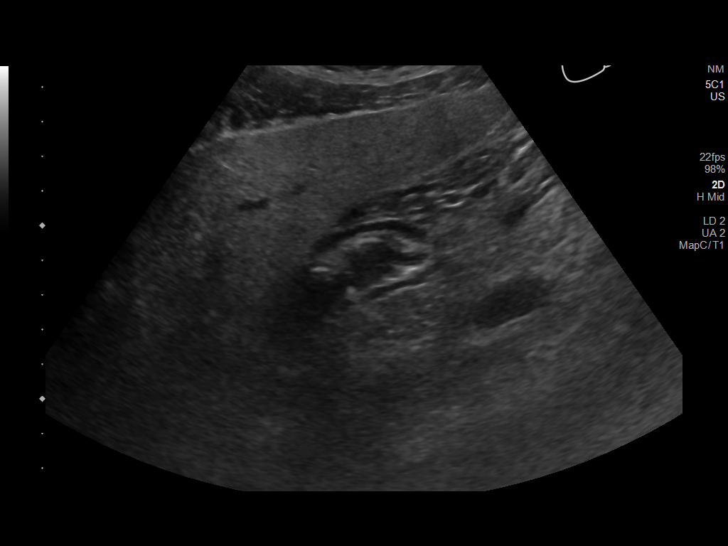
[im 52/63]
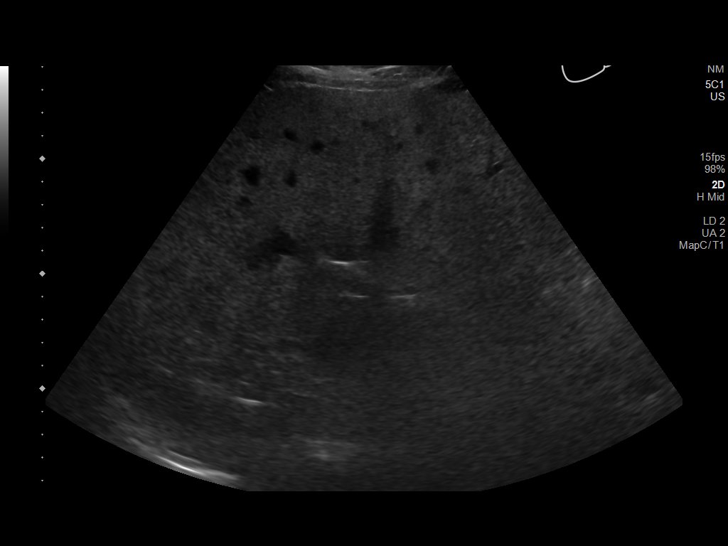
[im 57/63]
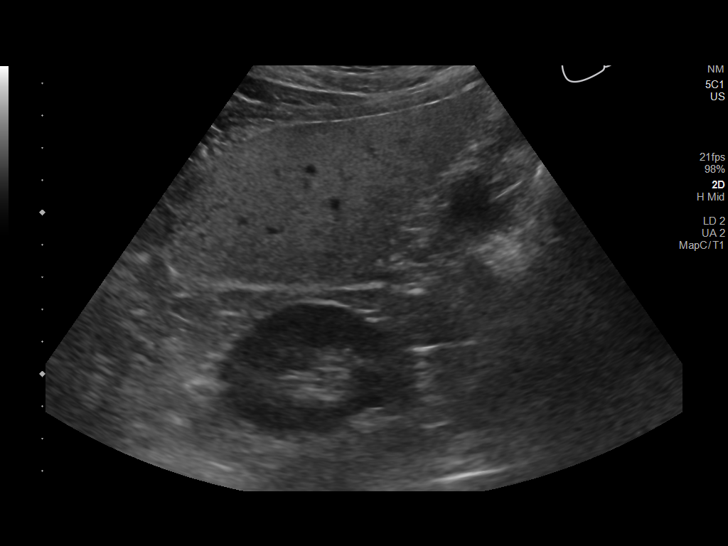
[im 63/63]
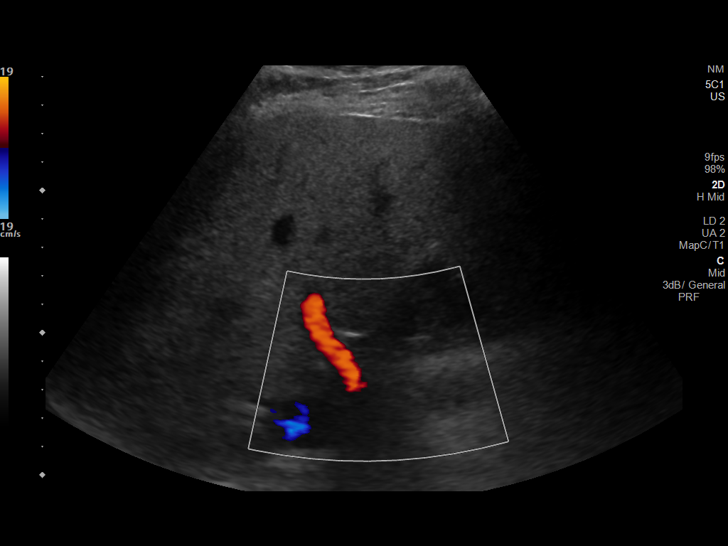

[14 of 25 positions shown; findings below may reference images not displayed]

FINDINGS: Gallbladder:

No gallstones or wall thickening visualized. No sonographic Murphy
sign noted by sonographer.

Common bile duct:

Diameter: 5 mm

Liver:

No focal lesion identified. Increased liver echogenicity. Portal
vein is patent on color Doppler imaging with normal direction of
blood flow towards the liver.
IMPRESSION: 1. Normal appearance of the gallbladder. No biliary ductal
dilatation.
2. Hepatic steatosis.

## 2021-09-18 ENCOUNTER — Other Ambulatory Visit: Payer: Self-pay | Admitting: Urology

## 2021-09-19 ENCOUNTER — Other Ambulatory Visit: Payer: Self-pay

## 2021-09-19 ENCOUNTER — Encounter (HOSPITAL_BASED_OUTPATIENT_CLINIC_OR_DEPARTMENT_OTHER): Payer: Self-pay | Admitting: Urology

## 2021-09-19 NOTE — Progress Notes (Signed)
Spoke w/ via phone for pre-op interview---pt Lab needs dos----    none           Lab results------none COVID test -----patient states asymptomatic no test needed Arrive at -------1015 am 09-29-2021 NPO after MN NO Solid Food.  Clear liquids from MN until---915 am Med rec completed Medications to take morning of surgery -----none Diabetic medication -----n/a Patient instructed no nail polish to be worn day of surgery Patient instructed to bring photo id and insurance card day of surgery Patient aware to have Driver (ride ) / caregiver    for 24 hours after surgery  dina spouse Patient Special Instructions -----none Pre-Op special Istructions -----none Patient verbalized understanding of instructions that were given at this phone interview. Patient denies shortness of breath, chest pain, fever, cough at this phone interview.

## 2021-09-29 ENCOUNTER — Ambulatory Visit (HOSPITAL_BASED_OUTPATIENT_CLINIC_OR_DEPARTMENT_OTHER): Payer: Managed Care, Other (non HMO) | Admitting: Certified Registered"

## 2021-09-29 ENCOUNTER — Encounter (HOSPITAL_BASED_OUTPATIENT_CLINIC_OR_DEPARTMENT_OTHER): Payer: Self-pay | Admitting: Urology

## 2021-09-29 ENCOUNTER — Encounter (HOSPITAL_BASED_OUTPATIENT_CLINIC_OR_DEPARTMENT_OTHER)
Admission: RE | Disposition: A | Discharge status: Home / Self Care | Payer: Self-pay | Source: Ambulatory Visit | Attending: Urology

## 2021-09-29 ENCOUNTER — Ambulatory Visit (HOSPITAL_BASED_OUTPATIENT_CLINIC_OR_DEPARTMENT_OTHER)
Admission: RE | Admit: 2021-09-29 | Discharge: 2021-09-29 | Disposition: A | Discharge status: Home / Self Care | Payer: Managed Care, Other (non HMO) | Source: Ambulatory Visit | Attending: Urology | Admitting: Urology

## 2021-09-29 DIAGNOSIS — F17200 Nicotine dependence, unspecified, uncomplicated: Secondary | ICD-10-CM | POA: Diagnosis not present

## 2021-09-29 DIAGNOSIS — Z08 Encounter for follow-up examination after completed treatment for malignant neoplasm: Secondary | ICD-10-CM | POA: Diagnosis not present

## 2021-09-29 DIAGNOSIS — Z8551 Personal history of malignant neoplasm of bladder: Secondary | ICD-10-CM | POA: Diagnosis not present

## 2021-09-29 DIAGNOSIS — N329 Bladder disorder, unspecified: Secondary | ICD-10-CM | POA: Insufficient documentation

## 2021-09-29 HISTORY — PX: CYSTOSCOPY WITH BIOPSY: SHX5122

## 2021-09-29 SURGERY — CYSTOSCOPY, WITH BIOPSY
Anesthesia: General | Site: Bladder | Laterality: Bilateral

## 2021-09-29 MED ORDER — SODIUM CHLORIDE 0.9% FLUSH: 3.0000 mL | Freq: Two times a day (BID) | INTRAVENOUS | Status: DC

## 2021-09-29 MED ORDER — KETOROLAC TROMETHAMINE 30 MG/ML IJ SOLN: 30.0000 mg | Freq: Once | INTRAMUSCULAR | Status: DC | PRN

## 2021-09-29 MED ORDER — OXYCODONE HCL 5 MG PO TABS: 5.0000 mg | ORAL_TABLET | Freq: Once | ORAL | Status: DC | PRN

## 2021-09-29 MED ORDER — DEXAMETHASONE SODIUM PHOSPHATE 10 MG/ML IJ SOLN
INTRAMUSCULAR | Status: AC
  Filled 2021-09-29: qty 1

## 2021-09-29 MED ORDER — CEFAZOLIN SODIUM-DEXTROSE 2-4 GM/100ML-% IV SOLN
INTRAVENOUS | Status: AC
  Filled 2021-09-29: qty 100

## 2021-09-29 MED ORDER — OXYCODONE HCL 5 MG/5ML PO SOLN: 5.0000 mg | Freq: Once | ORAL | Status: DC | PRN

## 2021-09-29 MED ORDER — LIDOCAINE 2% (20 MG/ML) 5 ML SYRINGE
INTRAMUSCULAR | Status: AC
  Filled 2021-09-29: qty 5

## 2021-09-29 MED ORDER — PROPOFOL 10 MG/ML IV BOLUS
INTRAVENOUS | Status: DC | PRN
  Administered 2021-09-29: 200 mg via INTRAVENOUS

## 2021-09-29 MED ORDER — FENTANYL CITRATE (PF) 100 MCG/2ML IJ SOLN
INTRAMUSCULAR | Status: DC | PRN
  Administered 2021-09-29 (×2): 25 ug via INTRAVENOUS
  Administered 2021-09-29: 50 ug via INTRAVENOUS

## 2021-09-29 MED ORDER — MIDAZOLAM HCL 2 MG/2ML IJ SOLN
INTRAMUSCULAR | Status: AC
  Filled 2021-09-29: qty 2

## 2021-09-29 MED ORDER — MORPHINE SULFATE (PF) 4 MG/ML IV SOLN: 2.0000 mg | INTRAVENOUS | Status: DC | PRN

## 2021-09-29 MED ORDER — ONDANSETRON HCL 4 MG/2ML IJ SOLN
INTRAMUSCULAR | Status: AC
  Filled 2021-09-29: qty 2

## 2021-09-29 MED ORDER — LACTATED RINGERS IV SOLN: INTRAVENOUS | Status: DC

## 2021-09-29 MED ORDER — ONDANSETRON HCL 4 MG/2ML IJ SOLN
INTRAMUSCULAR | Status: DC | PRN
  Administered 2021-09-29: 4 mg via INTRAVENOUS

## 2021-09-29 MED ORDER — ACETAMINOPHEN 325 MG RE SUPP: 650.0000 mg | RECTAL | Status: DC | PRN

## 2021-09-29 MED ORDER — SODIUM CHLORIDE (PF) 0.9 % IJ SOLN
INTRAMUSCULAR | Status: DC | PRN
  Administered 2021-09-29: 20 mL

## 2021-09-29 MED ORDER — SODIUM CHLORIDE 0.9 % IV SOLN: 250.0000 mL | INTRAVENOUS | Status: DC | PRN

## 2021-09-29 MED ORDER — MIDAZOLAM HCL 2 MG/2ML IJ SOLN
INTRAMUSCULAR | Status: DC | PRN
  Administered 2021-09-29: 2 mg via INTRAVENOUS

## 2021-09-29 MED ORDER — WHITE PETROLATUM EX OINT
TOPICAL_OINTMENT | CUTANEOUS | Status: AC
  Filled 2021-09-29: qty 5

## 2021-09-29 MED ORDER — SODIUM CHLORIDE 0.9% FLUSH: 3.0000 mL | INTRAVENOUS | Status: DC | PRN

## 2021-09-29 MED ORDER — ACETAMINOPHEN 325 MG PO TABS: 650.0000 mg | ORAL_TABLET | ORAL | Status: DC | PRN

## 2021-09-29 MED ORDER — FENTANYL CITRATE (PF) 100 MCG/2ML IJ SOLN
INTRAMUSCULAR | Status: AC
  Filled 2021-09-29: qty 2

## 2021-09-29 MED ORDER — CEFAZOLIN SODIUM-DEXTROSE 2-4 GM/100ML-% IV SOLN
2.0000 g | INTRAVENOUS | Status: AC
  Administered 2021-09-29: 2 g via INTRAVENOUS

## 2021-09-29 MED ORDER — FENTANYL CITRATE (PF) 100 MCG/2ML IJ SOLN: 25.0000 ug | INTRAMUSCULAR | Status: DC | PRN

## 2021-09-29 MED ORDER — DEXAMETHASONE SODIUM PHOSPHATE 10 MG/ML IJ SOLN
INTRAMUSCULAR | Status: DC | PRN
  Administered 2021-09-29: 5 mg via INTRAVENOUS

## 2021-09-29 MED ORDER — OXYCODONE HCL 5 MG PO TABS: 5.0000 mg | ORAL_TABLET | ORAL | Status: DC | PRN

## 2021-09-29 MED ORDER — STERILE WATER FOR IRRIGATION IR SOLN
Status: DC | PRN
  Administered 2021-09-29: 3000 mL

## 2021-09-29 MED ORDER — LIDOCAINE 2% (20 MG/ML) 5 ML SYRINGE
INTRAMUSCULAR | Status: DC | PRN
  Administered 2021-09-29: 100 mg via INTRAVENOUS

## 2021-09-29 MED ORDER — ONDANSETRON HCL 4 MG/2ML IJ SOLN: 4.0000 mg | Freq: Once | INTRAMUSCULAR | Status: DC | PRN

## 2021-09-29 SURGICAL SUPPLY — 19 items
BAG DRAIN URO-CYSTO SKYTR STRL (DRAIN) ×2 IMPLANT
BAG DRN UROCATH (DRAIN) ×1
CATH FOLEY 2WAY SLVR  5CC 16FR (CATHETERS)
CATH FOLEY 2WAY SLVR 5CC 16FR (CATHETERS) IMPLANT
CLOTH BEACON ORANGE TIMEOUT ST (SAFETY) ×2 IMPLANT
ELECT REM PT RETURN 9FT ADLT (ELECTROSURGICAL) ×2
ELECTRODE REM PT RTRN 9FT ADLT (ELECTROSURGICAL) ×1 IMPLANT
GLOVE SURG POLYISO LF SZ8 (GLOVE) ×2 IMPLANT
GOWN STRL REUS W/TWL LRG LVL3 (GOWN DISPOSABLE) ×4 IMPLANT
KIT TURNOVER CYSTO (KITS) ×2 IMPLANT
MANIFOLD NEPTUNE II (INSTRUMENTS) ×2 IMPLANT
NDL SAFETY ECLIPSE 18X1.5 (NEEDLE) IMPLANT
NEEDLE HYPO 18GX1.5 SHARP (NEEDLE)
NEEDLE HYPO 22GX1.5 SAFETY (NEEDLE) IMPLANT
NS IRRIG 500ML POUR BTL (IV SOLUTION) IMPLANT
PACK CYSTO (CUSTOM PROCEDURE TRAY) ×2 IMPLANT
SYR 20ML LL LF (SYRINGE) IMPLANT
TUBE CONNECTING 12X1/4 (SUCTIONS) ×2 IMPLANT
WATER STERILE IRR 3000ML UROMA (IV SOLUTION) ×2 IMPLANT

## 2021-09-29 NOTE — H&P (Signed)
I have bladder cancer that has been treated.  HPI: Oscar Church is a 60 year-old male established patient who is here for follow-up of bladder cancer treatment.  His bladder cancer was superficial and limitied to the bladder lining. His bladder cancer was not muscle invasive.   He did have a TURBT. His last bladder tumor was resected 07/30/2019.   He has not had blood in his urine recently.   His last cysto was 07/30/2019.   Oscar Church returns today in f/u for his history of bladder cancer. He last had a biopsy in 10/20 of a bladder lesion on the posterior wall. The path was just dysplasia. he has a his history of low grade non-invasive urothelial CA. He completed BCG on 11/10/18. He is doing well resolution of his LUTS. Cytology was negative in 4/21. His PSA went up with the BCG and a prostate biopsy in 7/20 was negative. The PSA came back down to 4.75 but is 5.24 prior to this visit. he is doing well without hematuria or dysuria. His IPSS is 1.   He had a TURBT on 11/28/17 for possible recurrence. His path just showed chronic inflammation. He had an initial TURBT on 04/30/17. He had several abnormal lesions in the bladder with biopsies from the posterior wall and bladder neck showing atypia and a lesion from the LL wall being LG NMIBC.     AUA Symptom Score: He never has the sensation of not emptying his bladder completely after finishing urinating. He never has to urinate again less that two hours after he has finished urinating. He does not have to stop and start again several times when he urinates. He never finds it difficult to postpone urination. He never has a weak urinary stream. He never has to push or strain to begin urination. He has to get up to urinate 1 time from the time he goes to bed until the time he gets up in the morning.   Calculated AUA Symptom Score: 1    ALLERGIES: None   MEDICATIONS: Simvastatin 20 mg tablet  Fenofibrate  Vitamin C     GU PSH: Bladder Instill AntiCA Agent  - 2020, 2020, 2019, 2019, 2019, 2019 Cystoscopy - 08/17/2020, 2021, 2020, 2020, 2020, 2019, 2019, 2018, 2018 Cystoscopy Fulguration - 2019, 2018 Cystoscopy TURBT <2 cm - 2020 Cystoscopy TURBT 2-5 cm - 2019, 2018 Locm 300-399Mg /Ml Iodine,1Ml - 2018 Prostate Needle Biopsy - 2020     NON-GU PSH: Surgical Pathology, Gross And Microscopic Examination For Prostate Needle - 2020     GU PMH: BPH w/o LUTS, He has BPH with BOO on cystoscopy but minimal LUTS. - 08/17/2020, He is voiding well and his exam remains benign but the PSA is up further. I believe we need to do a prostate [place] and biopsy. , - 2020 Elevated PSA, PSA in a year. - 08/17/2020, - 2020, - 2020 (Worsening), PSA is up to 4.3 but he finished BCG in 1/20. I will repeat the PSA and do an exam in 3 months. , - 2020 (Improving), His PSA has fallen further. He will need a repeat in a year. , - 2019, His PSA has been falling over the last several months so I will continue to watch it with a repeat in 6 months. , - 2019, He reports that Dr. Nancy Fetter told him his PSA is up. I have requested that result and will repeat the PSA with a week of abstinence in 3 months. The elevation is probably from his  recent instrumentation., - 2018 History of bladder cancer, He has no recurrent tumors but does have stable erythema on the posterior wall. I will get a cytology and if negative, have him return in 1 year for CT hematuria study and cystoscopy. - 08/17/2020, His repeat resection was benign. He will return in 6 months for cystoscopy. , - 2019, He has persistent post op edema and inflammation particularly on the left with associated urgency and dysuria. I am going to given him Uribel and will repeat cystoscopy in 3 months. If the lesion persists at that time, he will need reresection. , - 2018 Bladder Cancer overlapping sites - 2020, (Worsening), He has a possible recurrence on the left trigone and BN. Urine cytology today. I will set him up for a TURBT. I have  reviewed the risks including bleeding, infection, ureteral and bladder injury, need for secondary procedures, thrombotic events and anesthetic complications. , - 2019, He had LG NMIBC with patchy areas of atypia vs reactive disease. I am going to have him return in 3 months for cystoscopy with a cytology prior to the visit. I don't see a need for BCG at this time. , - 2018 Bladder tumor/neoplasm - 2020, He has a bladder lesion on the right bladder base that is worrisome for CIS. I will get a cytology today and get him set up for a cystoscopy with biopsy and fulguration. Risks of bleeding, infection, bladder injury, thrombotic events and anesthetic complications reviewed. , - 2018 BPH w/LUTS, He has mild LUTS with some intermittency. 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 He is going to need induction BCG with 1 year of maintenance. I will repeat a PSA prior to his next cystoscopy in about 4 months. BCG procedure and side effects reviewed in detail. - 2019 Urinary Urgency, I am going to give him Oxybutynin ER 5mg  daily. I have reviewed the side effects. - 2018 Gross hematuria - 2018, He had single episode of gross hematuria with a negative CT. He will be set up to return for cystoscopy., - 2018    NON-GU PMH: Hypercholesterolemia    FAMILY HISTORY: 2 daughters - Other 1 son - Other nephrolithiasis - No Family History   SOCIAL HISTORY: Marital Status: Married Preferred Language: English; Race: White Current Smoking Status: Patient smokes.   Tobacco Use Assessment Completed: Used Tobacco in last 30 days? Drinks 2 caffeinated drinks per day.    REVIEW OF SYSTEMS:    GU Review Male:   Patient reports get up at night to urinate. Patient denies frequent urination, hard to postpone urination, burning/ pain with urination, leakage of urine, stream starts and stops, trouble starting  your stream, have to strain to urinate , erection problems, and penile pain.  Gastrointestinal (Upper):   Patient denies vomiting, indigestion/ heartburn, and nausea.  Gastrointestinal (Lower):   Patient denies diarrhea and constipation.  Constitutional:   Patient denies fever, night sweats, weight loss, and fatigue.  Skin:   Patient denies skin rash/ lesion and itching.  Eyes:   Patient denies blurred vision and double vision.  Ears/ Nose/ Throat:   Patient denies sore throat and sinus problems.  Hematologic/Lymphatic:   Patient denies swollen glands and easy bruising.  Cardiovascular:   Patient denies leg swelling and chest pains.  Respiratory:   Patient denies cough and shortness of breath.  Endocrine:   Patient denies excessive thirst.  Musculoskeletal:   Patient denies back pain and joint pain.  Neurological:   Patient denies headaches and dizziness.  Psychologic:   Patient denies depression and anxiety.   VITAL SIGNS: None   Complexity of Data:  Records Review:   AUA Symptom Score, Previous Patient Records  Urine Test Review:   Urinalysis   09/06/21 02/08/20 04/16/19 01/07/19 08/14/18 11/05/17 07/23/17 04/22/17  PSA  Total PSA 5.24 ng/mL 4.75 ng/mL 6.94 ng/mL 4.30 ng/mL 2.73 ng/mL 3.51 ng/mL 3.96 ng/dl 4.03 ng/dl  Free PSA 0.80 ng/mL 0.76 ng/mL 0.93 ng/mL 0.83 ng/mL      % Free PSA 15 % PSA 16 % PSA 13 % PSA 19 % PSA        PROCEDURES:         Flexible Cystoscopy - 52000  Risks, benefits, and some of the potential complications of the procedure were discussed. 90ml of 2% lidocaine jelly was instilled intraurethrally.  Cipro 500mg  given for antibiotic prophylaxis.     Meatus:  Normal size. Normal location. Normal condition.  Urethra:  No strictures.  External Sphincter:  Normal.  Verumontanum:  Normal.  Prostate:  Obstructing. Moderate hyperplasia.  Bladder Neck:  Non-obstructing.  Ureteral Orifices:  Normal location. Normal size. Normal shape. Effluxed clear urine.   Bladder:  Mild trabeculation. Erythematous mucosa about 1.5 cm patch on the posterior wall/dome that is slightly velvety. This was previously found to be dysplastic but has been present for about 2 years but may be larger. No tumors. No stones.      The procedure was well tolerated and there were no complications.         Urinalysis Dipstick Dipstick Cont'd  Color: Yellow Bilirubin: Neg mg/dL  Appearance: Clear Ketones: Neg mg/dL  Specific Gravity: 1.025 Blood: Neg ery/uL  pH: <=5.0 Protein: Neg mg/dL  Glucose: Neg mg/dL Urobilinogen: 0.2 mg/dL    Nitrites: Neg    Leukocyte Esterase: Neg leu/uL    ASSESSMENT:      ICD-10 Details  1 GU:   History of bladder cancer - Z85.51 Chronic, Stable - He has no obvious recurrent tumors but does have some worsening erythema on the dome in the area of his prior biopsy with dysplasia. I am going to get him set up for cystoscopy with bil RTG's and biopsy. risks reviewed in detail.   2   Elevated PSA - R97.20 Chronic, Worsening - His PSA is up slightly but no enough to merit a biopsy at this time.   3   Bladder tumor/neoplasm - D41.4 Chronic, Worsening - He has some progression of the lesion on the dome.      PLAN:           Orders Labs Cytology w/reflex FISH          Schedule Return Visit/Planned Activity: Next Available Appointment - Schedule Surgery          Document Letter(s):  Created for Patient: Clinical Summary

## 2021-09-29 NOTE — Transfer of Care (Signed)
Immediate Anesthesia Transfer of Care Note  Patient: Oscar Church  Procedure(s) Performed: Procedure(s) (LRB): CYSTOSCOPY BILATERAL RETROGRADE WITH BLADDER BIOPSY (Bilateral)  Patient Location: PACU  Anesthesia Type: General  Level of Consciousness: awake, oriented, sedated and patient cooperative  Airway & Oxygen Therapy: Patient Spontanous Breathing and Patient connected to face mask oxygen  Post-op Assessment: Report given to PACU RN and Post -op Vital signs reviewed and stable  Post vital signs: Reviewed and stable  Complications: No apparent anesthesia complications Last Vitals:  Vitals Value Taken Time  BP    Temp    Pulse 70 09/29/21 1249  Resp 12 09/29/21 1249  SpO2 100 % 09/29/21 1249  Vitals shown include unvalidated device data.  Last Pain:  Vitals:   09/29/21 1020  TempSrc: Oral  PainSc: 0-No pain      Patients Stated Pain Goal: 8 (15/86/82 5749)  Complications: No notable events documented.

## 2021-09-29 NOTE — Anesthesia Postprocedure Evaluation (Signed)
Anesthesia Post Note  Patient: Oscar Church  Procedure(s) Performed: CYSTOSCOPY BILATERAL RETROGRADE WITH BLADDER BIOPSY (Bilateral: Bladder)     Patient location during evaluation: PACU Anesthesia Type: General Level of consciousness: awake and alert Pain management: pain level controlled Vital Signs Assessment: post-procedure vital signs reviewed and stable Respiratory status: spontaneous breathing, nonlabored ventilation, respiratory function stable and patient connected to nasal cannula oxygen Cardiovascular status: blood pressure returned to baseline and stable Postop Assessment: no apparent nausea or vomiting Anesthetic complications: no   No notable events documented.  Last Vitals:  Vitals:   09/29/21 1020  BP: 124/76  Pulse: (!) 58  Resp: 16  Temp: 36.4 C  SpO2: 99%    Last Pain:  Vitals:   09/29/21 1020  TempSrc: Oral  PainSc: 0-No pain                 Chantalle Defilippo S

## 2021-09-29 NOTE — Anesthesia Preprocedure Evaluation (Signed)
Anesthesia Evaluation  Patient identified by MRN, date of birth, ID band Patient awake    Reviewed: Allergy & Precautions, H&P , NPO status , Patient's Chart, lab work & pertinent test results  Airway Mallampati: II  TM Distance: >3 FB Neck ROM: Full    Dental no notable dental hx.    Pulmonary neg pulmonary ROS, former smoker,    Pulmonary exam normal breath sounds clear to auscultation       Cardiovascular negative cardio ROS Normal cardiovascular exam Rhythm:Regular Rate:Normal     Neuro/Psych negative neurological ROS  negative psych ROS   GI/Hepatic negative GI ROS, Neg liver ROS,   Endo/Other  negative endocrine ROS  Renal/GU negative Renal ROS  negative genitourinary   Musculoskeletal negative musculoskeletal ROS (+)   Abdominal   Peds negative pediatric ROS (+)  Hematology negative hematology ROS (+)   Anesthesia Other Findings   Reproductive/Obstetrics negative OB ROS                             Anesthesia Physical Anesthesia Plan  ASA: 1  Anesthesia Plan: General   Post-op Pain Management:    Induction: Intravenous  PONV Risk Score and Plan: 2 and Ondansetron, Dexamethasone and Treatment may vary due to age or medical condition  Airway Management Planned: LMA  Additional Equipment:   Intra-op Plan:   Post-operative Plan: Extubation in OR  Informed Consent: I have reviewed the patients History and Physical, chart, labs and discussed the procedure including the risks, benefits and alternatives for the proposed anesthesia with the patient or authorized representative who has indicated his/her understanding and acceptance.     Dental advisory given  Plan Discussed with: CRNA and Surgeon  Anesthesia Plan Comments:         Anesthesia Quick Evaluation

## 2021-09-29 NOTE — Op Note (Signed)
Procedure: 1.  Cystoscopy with bilateral retrograde pyelograms and interpretation. 2.  Cystoscopy with bladder biopsy and fulguration of 2.5 cm dome lesion and 0.5 cm left trigone lesion. 3.  Application of fluoroscopy.  Preop diagnosis: History of bladder cancer with lesion of the dome of the bladder and cytologic atypia.  Postop diagnosis: Same but also a small left trigone lesion.  Surgeon: Dr. Irine Seal.  Anesthesia: General.  Specimen: 1.  3 cup biopsies from the dome lesion. 2.  1 cup biopsy from the left trigone lesion.  Drains: None.  EBL: None.  Complications: None.  Indications: The patient is a 60 year old male with a history of bladder cancer who has been found on surveillance to have an anterior bladder wall lesion that appears to have progressed.  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  He had atypia on cytology but a negative FISH but it was still felt that repeat biopsy was indicated.  Procedure: He was taken operating room was given Ancef.  A general anesthetic was induced.  He was placed in lithotomy position and fitted with PAS hose.  His perineum and genitalia were prepped Betadine solution he was draped in usual sterile fashion.  Cystoscopy was performed using a 23 [place] scope and 30 degree lens.  Examination revealed a normal urethra.  The external sphincter was intact.  The prostatic urethra was approximately 3 to 4 cm in length with trilobar hyperplasia with some obstruction.  Examination of bladder demonstrated mild to moderate trabeculation.  There was some scarring on the base of the bladder from prior resection.  There was erythematous lesion on the dome that was somewhat patchy but approximately 2-1/2 cm in diameter.  Additionally just lateral to the left ureteral orifice which was slightly displaced from prior resection, there was a small area of erythema as well.  The right ureteral orifice was unremarkable.  Bilateral retrograde  pyelography was performed using a 5 [place] open-ended catheter and Omnipaque.  The left retrograde pyelogram demonstrated a normal ureter and intrarenal collecting system without filling defects.  The right retrograde pyelogram demonstrated normal ureter and intrarenal collecting system without filling defects.  A cup biopsy forceps was then used to take 3 cup biopsies from lesion on the dome and then 1 biopsy from the lesion lateral to the trigone.  The lesion on the dome was then generously fulgurated and is noted the diameter is approximately 2-1/2 cm.  All abnormal tissue was destroyed.  The biopsy site on the left trigone lateral to the orifice was then fulgurated as well.  Final inspection demonstrated no bleeding and no evidence of bladder wall perforation so was not felt a Foley was needed.  The bladder was drained and the cystoscope was removed.  He was taken down from lithotomy position, his anesthetic was reversed and he was moved recovery in stable condition.  There were no complications.

## 2021-09-29 NOTE — Interval H&P Note (Signed)
History and Physical Interval Note:  He had atypia on recent cytology but the [place] was negative.   09/29/2021 11:58 AM  Oscar Church  has presented today for surgery, with the diagnosis of BLADDER LESION.  The various methods of treatment have been discussed with the patient and family. After consideration of risks, benefits and other options for treatment, the patient has consented to  Procedure(s): CYSTOSCOPY BILATERAL RETROGRADE WITH BLADDER BIOPSY (Bilateral) as a surgical intervention.  The patient's history has been reviewed, patient examined, no change in status, stable for surgery.  I have reviewed the patient's chart and labs.  Questions were answered to the patient's satisfaction.     Irine Seal

## 2021-09-29 NOTE — Discharge Instructions (Addendum)
I will call with the biopsy results.    Post Anesthesia Home Care Instructions  Activity: Get plenty of rest for the remainder of the day. A responsible individual must stay with you for 24 hours following the procedure.  For the next 24 hours, DO NOT: -Drive a car -Paediatric nurse -Drink alcoholic beverages -Take any medication unless instructed by your physician -Make any legal decisions or sign important papers.  Meals: Start with liquid foods such as gelatin or soup. Progress to regular foods as tolerated. Avoid greasy, spicy, heavy foods. If nausea and/or vomiting occur, drink only clear liquids until the nausea and/or vomiting subsides. Call your physician if vomiting continues.  Special Instructions/Symptoms: Your throat may feel dry or sore from the anesthesia or the breathing tube placed in your throat during surgery. If this causes discomfort, gargle with warm salt water. The discomfort should disappear within 24 hours.  If you had a scopolamine patch placed behind your ear for the management of post- operative nausea and/or vomiting:  1. The medication in the patch is effective for 72 hours, after which it should be removed.  Wrap patch in a tissue and discard in the trash. Wash hands thoroughly with soap and water. 2. You may remove the patch earlier than 72 hours if you experience unpleasant side effects which may include dry mouth, dizziness or visual disturbances. 3. Avoid touching the patch. Wash your hands with soap and water after contact with the patch.

## 2021-09-29 NOTE — Anesthesia Procedure Notes (Signed)
Procedure Name: LMA Insertion Date/Time: 09/29/2021 12:20 PM Performed by: Suan Halter, CRNA Pre-anesthesia Checklist: Patient identified, Emergency Drugs available, Suction available and Patient being monitored Patient Re-evaluated:Patient Re-evaluated prior to induction Oxygen Delivery Method: Circle system utilized Preoxygenation: Pre-oxygenation with 100% oxygen Induction Type: IV induction Ventilation: Mask ventilation without difficulty LMA: LMA inserted LMA Size: 4.0 Number of attempts: 1 Airway Equipment and Method: Bite block Placement Confirmation: positive ETCO2 Tube secured with: Tape Dental Injury: Teeth and Oropharynx as per pre-operative assessment

## 2021-10-02 ENCOUNTER — Encounter (HOSPITAL_BASED_OUTPATIENT_CLINIC_OR_DEPARTMENT_OTHER): Payer: Self-pay | Admitting: Urology

## 2021-10-02 LAB — SURGICAL PATHOLOGY

## 2022-03-15 ENCOUNTER — Other Ambulatory Visit: Payer: Self-pay | Admitting: Urology

## 2022-04-11 ENCOUNTER — Other Ambulatory Visit: Payer: Self-pay

## 2022-04-11 ENCOUNTER — Encounter (HOSPITAL_BASED_OUTPATIENT_CLINIC_OR_DEPARTMENT_OTHER): Payer: Self-pay | Admitting: Urology

## 2022-04-11 NOTE — Progress Notes (Signed)
Spoke w/ via phone for pre-op interview--- Oscar Church needs dos---- NONE              Church results------ COVID test -----patient states asymptomatic no test needed Arrive at -------0800 NPO after MN NO Solid Food.  Clear liquids from MN until---0700 Med rec completed Medications to take morning of surgery -----NONE Diabetic medication ----- Patient instructed no nail polish to be worn day of surgery Patient instructed to bring photo id and insurance card day of surgery Patient aware to have Driver (ride ) / caregiver  Wife Oscar Church   for 24 hours after surgery  Patient Special Instructions ----- Pre-Op special Istructions ----- Patient verbalized understanding of instructions that were given at this phone interview. Patient denies shortness of breath, chest pain, fever, cough at this phone interview.

## 2022-04-16 NOTE — H&P (Signed)
I have bladder cancer that has been treated.  HPI: Oscar Church is a 61 year-old male established patient who is here for follow-up of bladder cancer treatment.  His bladder cancer was superficial and limitied to the bladder lining. His bladder cancer was not muscle invasive.   He did have a TURBT. His last bladder tumor was resected 07/30/2019.   He has not had blood in his urine recently.   His last cysto was 07/30/2019.   03/14/22: Oscar Church returns today in f/u for his history of bladder cancer. He last had a biopsy on 12/22 of a bladder lesion on the dome and it showed early papillary formation. he has a his history of low grade non-invasive urothelial CA. He completed BCG on 11/10/18. He is doing well resolution of his LUTS. Cytology had atypia but the FISH was negative in12/22. His PSA went up with the BCG and a prostate biopsy in 7/20 was negative. The PSA is back down to 3.62 prior to this visit. he is doing well without hematuria or dysuria. His IPSS is 1. His UA is clear. He reports some darkness of the semen.   He had a TURBT on 11/28/17 for possible recurrence. His path just showed chronic inflammation. He had an initial TURBT on 04/30/17. He had several abnormal lesions in the bladder with biopsies from the posterior wall and bladder neck showing atypia and a lesion from the LL wall being LG NMIBC.     AUA Symptom Score: He never has the sensation of not emptying his bladder completely after finishing urinating. Less than 20% of the time he has to urinate again fewer than two hours after he has finished urinating. He does not have to stop and start again several times when he urinates. He never finds it difficult to postpone urination. He never has a weak urinary stream. He never has to push or strain to begin urination. He has to get up to urinate 1 time from the time he goes to bed until the time he gets up in the morning.   Calculated AUA Symptom Score: 2    ALLERGIES: None    MEDICATIONS: Simvastatin 20 mg tablet  Fenofibrate  Vitamin C     GU PSH: Bladder Instill AntiCA Agent - 2020, 2020, 2019, 2019, 2019, 2019 Cystoscopy - 09/13/2021, 08/17/2020, 2021, 2020, 2020, 2020, 2019, 2019, 2018, 2018 Cystoscopy Fulguration - 2019, 2018 Cystoscopy TURBT <2 cm - 2020 Cystoscopy TURBT 2-5 cm - 09/29/2021, 2019, 2018 Locm 300-'399Mg'$ /Ml Iodine,1Ml - 2018 Prostate Needle Biopsy - 2020     NON-GU PSH: Surgical Pathology, Gross And Microscopic Examination For Prostate Needle - 2020     GU PMH: Bladder tumor/neoplasm, He had urothelial hyperplasia on his recent biopsy. He had dysplasia at last biopsy. He will return for cystoscopy in 6 months. - 10/16/2021, He has some progression of the lesion on the dome. , - 09/13/2021, - 2020, He has a bladder lesion on the right bladder base that is worrisome for CIS. I will get a cytology today and get him set up for a cystoscopy with biopsy and fulguration. Risks of bleeding, infection, bladder injury, thrombotic events and anesthetic complications reviewed. , - 2018 Elevated PSA, I will repeat the PSA in 6 months. - 10/16/2021, His PSA is up slightly but no enough to merit a biopsy at this time. , - 09/13/2021, PSA in a year., - 08/17/2020, - 2020, - 2020 (Worsening), PSA is up to 4.3 but he finished BCG in 1/20.  I will repeat the PSA and do an exam in 3 months. , - 2020 (Improving), His PSA has fallen further. He will need a repeat in a year. , - 2019, His PSA has been falling over the last several months so I will continue to watch it with a repeat in 6 months. , - 2019, He reports that Dr. Nancy Fetter told him his PSA is up. I have requested that result and will repeat the PSA with a week of abstinence in 3 months. The elevation is probably from his recent instrumentation., - 2018 History of bladder cancer, He has no obvious recurrent tumors but does have some worsening erythema on the dome in the area of his prior biopsy with dysplasia. I am  going to get him set up for cystoscopy with bil RTG's and biopsy. risks reviewed in detail. - 09/13/2021, He has no recurrent tumors but does have stable erythema on the posterior wall. I will get a cytology and if negative, have him return in 1 year for CT hematuria study and cystoscopy. , - 08/17/2020, His repeat resection was benign. He will return in 6 months for cystoscopy. , - 2019, He has persistent post op edema and inflammation particularly on the left with associated urgency and dysuria. I am going to given him Uribel and will repeat cystoscopy in 3 months. If the lesion persists at that time, he will need reresection. , - 2018 BPH w/o LUTS, He has BPH with BOO on cystoscopy but minimal LUTS. - 08/17/2020, He is voiding well and his exam remains benign but the PSA is up further. I believe we need to do a prostate [place] and biopsy. , - 2020 Bladder Cancer overlapping sites - 2020, (Worsening), He has a possible recurrence on the left trigone and BN. Urine cytology today. I will set him up for a TURBT. I have reviewed the risks including bleeding, infection, ureteral and bladder injury, need for secondary procedures, thrombotic events and anesthetic complications. , - 2019, He had LG NMIBC with patchy areas of atypia vs reactive disease. I am going to have him return in 3 months for cystoscopy with a cytology prior to the visit. I don't see a need for BCG at this time. , - 2018 BPH w/LUTS, He has mild LUTS with some intermittency. 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 He is going to need induction BCG with 1 year of maintenance. I will repeat a PSA prior to his next cystoscopy in about 4 months. BCG procedure and side effects reviewed in detail. - 2019 Urinary Urgency, I am going to give him Oxybutynin ER '5mg'$  daily. I have reviewed the side effects. - 2018 Gross hematuria - 2018, He  had single episode of gross hematuria with a negative CT. He will be set up to return for cystoscopy., - 2018    NON-GU PMH: Hypercholesterolemia    FAMILY HISTORY: 2 daughters - Other 1 son - Other nephrolithiasis - No Family History   SOCIAL HISTORY: Marital Status: Married Preferred Language: English; Race: White Current Smoking Status: Patient smokes.   Tobacco Use Assessment Completed: Used Tobacco in last 30 days? Drinks 2 caffeinated drinks per day.    REVIEW OF SYSTEMS:    GU Review Male:   Patient reports get up at night to urinate. Patient denies frequent urination, hard to postpone urination, burning/ pain with urination, leakage of urine,  stream starts and stops, trouble starting your stream, have to strain to urinate , erection problems, and penile pain.  Gastrointestinal (Upper):   Patient denies nausea, vomiting, and indigestion/ heartburn.  Gastrointestinal (Lower):   Patient denies constipation and diarrhea.  Constitutional:   Patient denies fever, night sweats, weight loss, and fatigue.  Skin:   Patient denies skin rash/ lesion and itching.  Eyes:   Patient denies blurred vision and double vision.  Ears/ Nose/ Throat:   Patient denies sore throat and sinus problems.  Hematologic/Lymphatic:   Patient denies swollen glands and easy bruising.  Cardiovascular:   Patient denies leg swelling and chest pains.  Respiratory:   Patient denies cough and shortness of breath.  Endocrine:   Patient denies excessive thirst.  Musculoskeletal:   Patient denies back pain and joint pain.  Neurological:   Patient denies headaches and dizziness.  Psychologic:   Patient denies depression and anxiety.   VITAL SIGNS: None   MULTI-SYSTEM PHYSICAL EXAMINATION:    Constitutional: Well-nourished. No physical deformities. Normally developed. Good grooming.  Respiratory: Normal breath sounds. No labored breathing, no use of accessory muscles.   Cardiovascular: Regular rate and rhythm. No  murmur, no gallop.      Complexity of Data:  Lab Test Review:   PSA, Urine Cytology  Records Review:   AUA Symptom Score, Previous Patient Records  Urine Test Review:   Urinalysis   03/07/22 09/06/21 02/08/20 04/16/19 01/07/19 08/14/18 11/05/17 07/23/17  PSA  Total PSA 3.62 ng/mL 5.24 ng/mL 4.75 ng/mL 6.94 ng/mL 4.30 ng/mL 2.73 ng/mL 3.51 ng/mL 3.96 ng/dl  Free PSA  0.80 ng/mL 0.76 ng/mL 0.93 ng/mL 0.83 ng/mL     % Free PSA  15 % PSA 16 % PSA 13 % PSA 19 % PSA       PROCEDURES:         Flexible Cystoscopy - 52000  Risks, benefits, and some of the potential complications of the procedure were discussed. 53m of 2% lidocaine jelly was instilled intraurethrally.  Cipro '500mg'$  given for antibiotic prophylaxis.     Meatus:  Normal size. Normal location. Normal condition.  Urethra:  No strictures.  External Sphincter:  Normal.  Verumontanum:  Normal.  Prostate:  Obstructing. Enlarged median lobe. Moderate hyperplasia.  Bladder Neck:  Non-obstructing.  Ureteral Orifices:  Normal location. Normal size. Normal shape. Effluxed clear urine.  Bladder:  No trabeculation. No tumors. there is a scar with a small adherent stone in the left bladder neck area and a 2cm lesion with some mucosal erythema and irregularity that is concerning for an early recurrence and it is adjacent to the scar from the last biopsy.       The procedure was well tolerated and there were no complications.         Urinalysis Dipstick Dipstick Cont'd  Color: Yellow Bilirubin: Neg mg/dL  Appearance: Clear Ketones: Neg mg/dL  Specific Gravity: 1.020 Blood: Neg ery/uL  pH: <=5.0 Protein: Neg mg/dL  Glucose: Neg mg/dL Urobilinogen: 0.2 mg/dL    Nitrites: Neg    Leukocyte Esterase: Neg leu/uL    ASSESSMENT:      ICD-10 Details  1 GU:   History of bladder cancer - Z85.51 Chronic, Worsening - He has a small lesion on the dome adjacent to his prior resection/biopsy site that is concerniing for a recurrence. I have  recommended a TURBT with possible gemcitabine. I have reivewed the risks of bleeding, infection, bladder injury, chemical cystitis, thrombotic events and anesthetic complications. He has  a tiny bladder stone I will remove as well.   2   Elevated PSA - R97.20 His PSA has come back down and will need a repeat in a year.   3   Bladder tumor/neoplasm - D41.4 Undiagnosed New Problem - Small lesion on the dome with some papillary changes.    PLAN:           Schedule Return Visit/Planned Activity: Next Available Appointment - Schedule Surgery  Procedure: Unspecified Date - Cystoscopy TURBT <2 cm - 40768 Notes: Next available.

## 2022-04-17 ENCOUNTER — Encounter (HOSPITAL_BASED_OUTPATIENT_CLINIC_OR_DEPARTMENT_OTHER)
Admission: RE | Disposition: A | Discharge status: Home / Self Care | Payer: Self-pay | Source: Home / Self Care | Attending: Urology

## 2022-04-17 ENCOUNTER — Ambulatory Visit (HOSPITAL_BASED_OUTPATIENT_CLINIC_OR_DEPARTMENT_OTHER): Payer: Managed Care, Other (non HMO) | Admitting: Anesthesiology

## 2022-04-17 ENCOUNTER — Ambulatory Visit (HOSPITAL_BASED_OUTPATIENT_CLINIC_OR_DEPARTMENT_OTHER)
Admission: RE | Admit: 2022-04-17 | Discharge: 2022-04-17 | Disposition: A | Discharge status: Home / Self Care | Payer: Managed Care, Other (non HMO) | Attending: Urology | Admitting: Urology

## 2022-04-17 ENCOUNTER — Encounter (HOSPITAL_BASED_OUTPATIENT_CLINIC_OR_DEPARTMENT_OTHER): Payer: Self-pay | Admitting: Urology

## 2022-04-17 DIAGNOSIS — N3289 Other specified disorders of bladder: Secondary | ICD-10-CM | POA: Diagnosis not present

## 2022-04-17 DIAGNOSIS — D090 Carcinoma in situ of bladder: Secondary | ICD-10-CM | POA: Insufficient documentation

## 2022-04-17 DIAGNOSIS — N210 Calculus in bladder: Secondary | ICD-10-CM | POA: Diagnosis present

## 2022-04-17 DIAGNOSIS — Z8551 Personal history of malignant neoplasm of bladder: Secondary | ICD-10-CM

## 2022-04-17 DIAGNOSIS — Z87891 Personal history of nicotine dependence: Secondary | ICD-10-CM | POA: Diagnosis not present

## 2022-04-17 HISTORY — PX: TRANSURETHRAL RESECTION OF BLADDER TUMOR WITH MITOMYCIN-C: SHX6459

## 2022-04-17 SURGERY — TRANSURETHRAL RESECTION OF BLADDER TUMOR WITH MITOMYCIN-C
Anesthesia: General | Site: Bladder

## 2022-04-17 MED ORDER — LIDOCAINE 2% (20 MG/ML) 5 ML SYRINGE
INTRAMUSCULAR | Status: DC | PRN
  Administered 2022-04-17: 40 mg via INTRAVENOUS

## 2022-04-17 MED ORDER — LACTATED RINGERS IV SOLN: INTRAVENOUS | Status: DC

## 2022-04-17 MED ORDER — DEXAMETHASONE SODIUM PHOSPHATE 10 MG/ML IJ SOLN
INTRAMUSCULAR | Status: AC
  Filled 2022-04-17: qty 1

## 2022-04-17 MED ORDER — FENTANYL CITRATE (PF) 100 MCG/2ML IJ SOLN
INTRAMUSCULAR | Status: DC | PRN
  Administered 2022-04-17: 100 ug via INTRAVENOUS
  Administered 2022-04-17: 50 ug via INTRAVENOUS

## 2022-04-17 MED ORDER — FENTANYL CITRATE (PF) 100 MCG/2ML IJ SOLN: 25.0000 ug | INTRAMUSCULAR | Status: DC | PRN

## 2022-04-17 MED ORDER — GEMCITABINE CHEMO FOR BLADDER INSTILLATION 2000 MG
2000.0000 mg | Freq: Once | INTRAVENOUS | Status: DC
  Filled 2022-04-17: qty 52.6

## 2022-04-17 MED ORDER — FENTANYL CITRATE (PF) 100 MCG/2ML IJ SOLN
INTRAMUSCULAR | Status: AC
  Filled 2022-04-17: qty 2

## 2022-04-17 MED ORDER — DEXAMETHASONE SODIUM PHOSPHATE 10 MG/ML IJ SOLN
INTRAMUSCULAR | Status: DC | PRN
  Administered 2022-04-17: 10 mg via INTRAVENOUS

## 2022-04-17 MED ORDER — OXYCODONE HCL 5 MG PO TABS: 5.0000 mg | ORAL_TABLET | Freq: Once | ORAL | Status: DC | PRN

## 2022-04-17 MED ORDER — MORPHINE SULFATE (PF) 4 MG/ML IV SOLN: 2.0000 mg | INTRAVENOUS | Status: DC | PRN

## 2022-04-17 MED ORDER — CEFAZOLIN SODIUM-DEXTROSE 2-4 GM/100ML-% IV SOLN
INTRAVENOUS | Status: AC
  Filled 2022-04-17: qty 100

## 2022-04-17 MED ORDER — MIDAZOLAM HCL 2 MG/2ML IJ SOLN
INTRAMUSCULAR | Status: AC
  Filled 2022-04-17: qty 2

## 2022-04-17 MED ORDER — ONDANSETRON HCL 4 MG/2ML IJ SOLN
INTRAMUSCULAR | Status: AC
  Filled 2022-04-17: qty 2

## 2022-04-17 MED ORDER — ACETAMINOPHEN 500 MG PO TABS
1000.0000 mg | ORAL_TABLET | Freq: Once | ORAL | Status: AC
  Administered 2022-04-17: 1000 mg via ORAL

## 2022-04-17 MED ORDER — MEPERIDINE HCL 25 MG/ML IJ SOLN: 6.2500 mg | INTRAMUSCULAR | Status: DC | PRN

## 2022-04-17 MED ORDER — SODIUM CHLORIDE 0.9% FLUSH: 3.0000 mL | Freq: Two times a day (BID) | INTRAVENOUS | Status: DC

## 2022-04-17 MED ORDER — SODIUM CHLORIDE 0.9% FLUSH: 3.0000 mL | INTRAVENOUS | Status: DC | PRN

## 2022-04-17 MED ORDER — OXYCODONE HCL 5 MG PO TABS: 5.0000 mg | ORAL_TABLET | ORAL | Status: DC | PRN

## 2022-04-17 MED ORDER — SODIUM CHLORIDE 0.9 % IV SOLN
250.0000 mL | INTRAVENOUS | Status: DC | PRN
Start: 2022-04-17 — End: 2022-04-17

## 2022-04-17 MED ORDER — ACETAMINOPHEN 325 MG RE SUPP: 650.0000 mg | RECTAL | Status: DC | PRN

## 2022-04-17 MED ORDER — PROPOFOL 10 MG/ML IV BOLUS
INTRAVENOUS | Status: DC | PRN
  Administered 2022-04-17: 150 mg via INTRAVENOUS
  Administered 2022-04-17: 20 mg via INTRAVENOUS
  Administered 2022-04-17: 30 mg via INTRAVENOUS

## 2022-04-17 MED ORDER — ONDANSETRON HCL 4 MG/2ML IJ SOLN
INTRAMUSCULAR | Status: DC | PRN
  Administered 2022-04-17: 4 mg via INTRAVENOUS

## 2022-04-17 MED ORDER — CEFAZOLIN SODIUM-DEXTROSE 2-4 GM/100ML-% IV SOLN
2.0000 g | INTRAVENOUS | Status: AC
  Administered 2022-04-17: 2 g via INTRAVENOUS

## 2022-04-17 MED ORDER — ACETAMINOPHEN 325 MG PO TABS: 650.0000 mg | ORAL_TABLET | ORAL | Status: DC | PRN

## 2022-04-17 MED ORDER — SODIUM CHLORIDE 0.9 % IR SOLN
Status: DC | PRN
  Administered 2022-04-17: 3000 mL via INTRAVESICAL

## 2022-04-17 MED ORDER — ROCURONIUM BROMIDE 10 MG/ML (PF) SYRINGE
PREFILLED_SYRINGE | INTRAVENOUS | Status: AC
  Filled 2022-04-17: qty 10

## 2022-04-17 MED ORDER — LIDOCAINE HCL (PF) 2 % IJ SOLN
INTRAMUSCULAR | Status: AC
  Filled 2022-04-17: qty 10

## 2022-04-17 MED ORDER — PROPOFOL 10 MG/ML IV BOLUS
INTRAVENOUS | Status: AC
  Filled 2022-04-17: qty 20

## 2022-04-17 MED ORDER — MIDAZOLAM HCL 5 MG/5ML IJ SOLN
INTRAMUSCULAR | Status: DC | PRN
  Administered 2022-04-17: 2 mg via INTRAVENOUS

## 2022-04-17 MED ORDER — ACETAMINOPHEN 500 MG PO TABS
ORAL_TABLET | ORAL | Status: AC
  Filled 2022-04-17: qty 2

## 2022-04-17 MED ORDER — MIDAZOLAM HCL 2 MG/2ML IJ SOLN: 0.5000 mg | Freq: Once | INTRAMUSCULAR | Status: DC | PRN

## 2022-04-17 MED ORDER — 0.9 % SODIUM CHLORIDE (POUR BTL) OPTIME
TOPICAL | Status: DC | PRN
  Administered 2022-04-17: 500 mL

## 2022-04-17 MED ORDER — TRAMADOL HCL 50 MG PO TABS: 50.0000 mg | ORAL_TABLET | Freq: Four times a day (QID) | ORAL | 0 refills | Status: AC | PRN

## 2022-04-17 MED ORDER — OXYCODONE HCL 5 MG/5ML PO SOLN: 5.0000 mg | Freq: Once | ORAL | Status: DC | PRN

## 2022-04-17 SURGICAL SUPPLY — 26 items
BAG DRAIN URO-CYSTO SKYTR STRL (DRAIN) ×2 IMPLANT
BAG DRN RND TRDRP ANRFLXCHMBR (UROLOGICAL SUPPLIES) ×1
BAG DRN UROCATH (DRAIN) ×1
BAG URINE DRAIN 2000ML AR STRL (UROLOGICAL SUPPLIES) ×1 IMPLANT
BAG URINE LEG 500ML (DRAIN) IMPLANT
CATH FOLEY 2WAY SLVR  5CC 20FR (CATHETERS)
CATH FOLEY 2WAY SLVR  5CC 22FR (CATHETERS)
CATH FOLEY 2WAY SLVR 5CC 20FR (CATHETERS) IMPLANT
CATH FOLEY 2WAY SLVR 5CC 22FR (CATHETERS) IMPLANT
CLOTH BEACON ORANGE TIMEOUT ST (SAFETY) ×2 IMPLANT
ELECT REM PT RETURN 9FT ADLT (ELECTROSURGICAL)
ELECTRODE REM PT RTRN 9FT ADLT (ELECTROSURGICAL) ×1 IMPLANT
GLOVE SURG SS PI 8.0 STRL IVOR (GLOVE) ×2 IMPLANT
GOWN STRL REUS W/TWL XL LVL3 (GOWN DISPOSABLE) ×2 IMPLANT
HOLDER FOLEY CATH W/STRAP (MISCELLANEOUS) ×1 IMPLANT
IV NS IRRIG 3000ML ARTHROMATIC (IV SOLUTION) ×3 IMPLANT
KIT TURNOVER CYSTO (KITS) ×2 IMPLANT
LOOP CUT BIPOLAR 24F LRG (ELECTROSURGICAL) ×1 IMPLANT
MANIFOLD NEPTUNE II (INSTRUMENTS) ×2 IMPLANT
NS IRRIG 500ML POUR BTL (IV SOLUTION) ×1 IMPLANT
PACK CYSTO (CUSTOM PROCEDURE TRAY) ×2 IMPLANT
PLUG CATH AND CAP STER (CATHETERS) IMPLANT
SYR TOOMEY IRRIG 70ML (MISCELLANEOUS) ×2
SYRINGE TOOMEY IRRIG 70ML (MISCELLANEOUS) IMPLANT
TUBE CONNECTING 12X1/4 (SUCTIONS) ×2 IMPLANT
TUBING UROLOGY SET (TUBING) ×2 IMPLANT

## 2022-04-17 NOTE — Discharge Instructions (Addendum)
    Post Anesthesia Home Care Instructions  Activity: Get plenty of rest for the remainder of the day. A responsible individual must stay with you for 24 hours following the procedure.  For the next 24 hours, DO NOT: -Drive a car -Paediatric nurse -Drink alcoholic beverages -Take any medication unless instructed by your physician -Make any legal decisions or sign important papers.  Meals: Start with liquid foods such as gelatin or soup. Progress to regular foods as tolerated. Avoid greasy, spicy, heavy foods. If nausea and/or vomiting occur, drink only clear liquids until the nausea and/or vomiting subsides. Call your physician if vomiting continues.  Special Instructions/Symptoms: Your throat may feel dry or sore from the anesthesia or the breathing tube placed in your throat during surgery. If this causes discomfort, gargle with warm salt water. The discomfort should disappear within 24 hours.  Tylenol received at approximately 8:15 a.m.; if needed next dose is available after 2 p.m.

## 2022-04-17 NOTE — Interval H&P Note (Signed)
History and Physical Interval Note:  04/17/2022 9:25 AM  Oscar Church  has presented today for surgery, with the diagnosis of BLADDER LESION AND SMALL BLADDER STONE.  The various methods of treatment have been discussed with the patient and family. After consideration of risks, benefits and other options for treatment, the patient has consented to  Procedure(s): TRANSURETHRAL RESECTION OF BLADDER TUMOR WITH POSSIBLE GEMCITABINE CYSTOSCOPY WITH SMALL BLADDER STONE REMOVAL (N/A) as a surgical intervention.  The patient's history has been reviewed, patient examined, no change in status, stable for surgery.  I have reviewed the patient's chart and labs.  Questions were answered to the patient's satisfaction.     Irine Seal

## 2022-04-17 NOTE — Anesthesia Procedure Notes (Signed)
Procedure Name: LMA Insertion Date/Time: 04/17/2022 9:57 AM  Performed by: Rogers Blocker, CRNAPre-anesthesia Checklist: Patient identified, Emergency Drugs available, Suction available and Patient being monitored Patient Re-evaluated:Patient Re-evaluated prior to induction Oxygen Delivery Method: Circle System Utilized Preoxygenation: Pre-oxygenation with 100% oxygen Induction Type: IV induction Ventilation: Mask ventilation without difficulty LMA: LMA inserted LMA Size: 5.0 Number of attempts: 1 Placement Confirmation: positive ETCO2 Tube secured with: Tape Dental Injury: Teeth and Oropharynx as per pre-operative assessment

## 2022-04-17 NOTE — Anesthesia Postprocedure Evaluation (Signed)
Anesthesia Post Note  Patient: Oscar Church  Procedure(s) Performed: TRANSURETHRAL RESECTION OF BLADDER TUMOR WITH CYSTOSCOPY LEFT URETEROSCOPY WITH SMALL BLADDER STONE REMOVAL (Bladder)     Patient location during evaluation: Phase II Anesthesia Type: General Level of consciousness: awake and alert, patient cooperative and oriented Pain management: pain level controlled Vital Signs Assessment: post-procedure vital signs reviewed and stable Respiratory status: spontaneous breathing, nonlabored ventilation and respiratory function stable Cardiovascular status: blood pressure returned to baseline and stable Postop Assessment: no apparent nausea or vomiting, adequate PO intake and able to ambulate Anesthetic complications: no   No notable events documented.  Last Vitals:  Vitals:   04/17/22 1100 04/17/22 1120  BP: 113/72 (!) 141/85  Pulse: 64 (!) 55  Resp: 16 17  Temp:  (!) 36.4 C  SpO2: 96% 100%    Last Pain:  Vitals:   04/17/22 1120  TempSrc:   PainSc: 0-No pain                 Michol Emory,E. Bannie Lobban

## 2022-04-17 NOTE — Op Note (Signed)
Procedure: 1.  Cystoscopy with removal of small bladder stone. 2.  Transurethral resection of 2 cm bladder lesion. 3.  Diagnostic left ureteroscopy.  Preop diagnosis: 1.  History of bladder cancer with mucosal abnormality adjacent to the left ureteral orifice and a small adherent bladder stone.  Postop diagnosis: Same.  Surgeon: Dr. Irine Seal.  Anesthesia: General.  Specimen: Bladder mucosal biopsies and small bladder stone.  EBL: None.  Drain: None.  Complications: None.  Indications: The patient is a 61 year old male with a history of bladder cancer who was found on recent cystoscopy to have erythema adjacent to his prior resection scar and was felt a biopsy was indicated.  He was also noted to have a small stone adherent to the mucosa in this area.  Procedure: He was taken operating room where he was given antibiotic.  A general anesthetic was induced.  He was placed in lithotomy position and fitted with PAS hose.  His perineum and genitalia were prepped with Betadine solution was draped in usual sterile fashion.  Cystoscopy was performed using the 21 [place] scope and 30 degree lens.  Examination revealed a normal urethra.  The external sphincter was intact.  The prostatic urethra was short but had trilobar hyperplasia with a small middle lobe.  Examination of bladder revealed mild trabeculation.  The right ureteral orifice was unremarkable.  The left ureteral orifice had evidence of prior resection with some surrounding erythematous mucosa in the scar just superior to the orifice with additional erythematous mucosa on the periphery.  There was a small adherent stone in the middle of the erythematous mucosa but no papillary tumors were seen.  A grasping forceps was passed through the cystoscope and a small bladder stone was removed without difficulty.  The urethra was calibrated to 31 [place] with meatal dilators and the 26 French continuous-flow resectoscope sheath was placed via  visual obturator.  The sheath was then fitted with an Beatrix Fetters handle with a 30 degree lens and a bipolar loop with saline as irrigant.  The abnormal mucosa adjacent to the ureteral orifice was resected with eventual circumferential resection around the orifice, but I did not resect the orifice itself.  The resection was approximately 2 cm and was in the muscle.  Hemostasis was secured and the specimen was retrieved to send the pathology.  I then removed the resectoscope and passed a 6.5 French dual-lumen short semirigid ureteroscope which I was able to negotiate up the left ureteral orifice without difficulty.  Inspection of the distal ureter revealed no concerning mucosal lesions.  Because the abnormality was not clearly a bladder tumor recurrence and resection was adjacent to the ureteral orifice, I elected to forego gemcitabine instillation for concern that it would stenosed the orifice.  His bladder was drained with the cystoscope which was then removed.  It was not felt that a Foley catheter was needed.  He was taken down from lithotomy position, his anesthetic was reversed and he was moved recovery in stable condition.  There were no complications.

## 2022-04-17 NOTE — Transfer of Care (Signed)
Immediate Anesthesia Transfer of Care Note  Patient: Oscar Church  Procedure(s) Performed: TRANSURETHRAL RESECTION OF BLADDER TUMOR WITH CYSTOSCOPY LEFT URETEROSCOPY WITH SMALL BLADDER STONE REMOVAL (Bladder)  Patient Location: PACU  Anesthesia Type:General  Level of Consciousness: awake, alert , oriented and patient cooperative  Airway & Oxygen Therapy: Patient Spontanous Breathing and Patient connected to face mask oxygen  Post-op Assessment: Report given to RN and Post -op Vital signs reviewed and stable  Post vital signs: Reviewed and stable  Last Vitals:  Vitals Value Taken Time  BP 110/75 04/17/22 1030  Temp    Pulse 79 04/17/22 1033  Resp 12 04/17/22 1033  SpO2 97 % 04/17/22 1033  Vitals shown include unvalidated device data.  Last Pain:  Vitals:   04/17/22 0811  TempSrc: Oral  PainSc: 0-No pain      Patients Stated Pain Goal: 5 (86/76/72 0947)  Complications: No notable events documented.

## 2022-04-17 NOTE — Anesthesia Preprocedure Evaluation (Addendum)
Anesthesia Evaluation  Patient identified by MRN, date of birth, ID band Patient awake    Reviewed: Allergy & Precautions, NPO status , Patient's Chart, lab work & pertinent test results  History of Anesthesia Complications Negative for: history of anesthetic complications  Airway Mallampati: II  TM Distance: <3 FB Neck ROM: Full    Dental  (+) Dental Advisory Given, Caps   Pulmonary former smoker,    breath sounds clear to auscultation       Cardiovascular negative cardio ROS   Rhythm:Regular Rate:Normal     Neuro/Psych negative neurological ROS     GI/Hepatic negative GI ROS, Neg liver ROS,   Endo/Other  negative endocrine ROSobese  Renal/GU negative Renal ROS   Bladder tumor, stone    Musculoskeletal   Abdominal (+) + obese,   Peds  Hematology negative hematology ROS (+)   Anesthesia Other Findings   Reproductive/Obstetrics                           Anesthesia Physical Anesthesia Plan  ASA: 2  Anesthesia Plan: General   Post-op Pain Management: Tylenol PO (pre-op)*   Induction: Intravenous  PONV Risk Score and Plan: 2 and Ondansetron and Dexamethasone  Airway Management Planned: LMA  Additional Equipment: None  Intra-op Plan:   Post-operative Plan:   Informed Consent: I have reviewed the patients History and Physical, chart, labs and discussed the procedure including the risks, benefits and alternatives for the proposed anesthesia with the patient or authorized representative who has indicated his/her understanding and acceptance.     Dental advisory given  Plan Discussed with: CRNA and Surgeon  Anesthesia Plan Comments:        Anesthesia Quick Evaluation

## 2022-04-18 ENCOUNTER — Encounter (HOSPITAL_BASED_OUTPATIENT_CLINIC_OR_DEPARTMENT_OTHER): Payer: Self-pay | Admitting: Urology

## 2022-04-18 LAB — SURGICAL PATHOLOGY

## 2022-04-26 ENCOUNTER — Other Ambulatory Visit: Payer: Self-pay | Admitting: Urology

## 2022-04-26 DIAGNOSIS — D494 Neoplasm of unspecified behavior of bladder: Secondary | ICD-10-CM

## 2022-04-26 DIAGNOSIS — D499 Neoplasm of unspecified behavior of unspecified site: Secondary | ICD-10-CM

## 2023-01-16 ENCOUNTER — Ambulatory Visit (HOSPITAL_COMMUNITY)
Admission: RE | Admit: 2023-01-16 | Discharge: 2023-01-16 | Disposition: A | Discharge status: Home / Self Care | Payer: Managed Care, Other (non HMO) | Source: Ambulatory Visit | Attending: Cardiovascular Disease | Admitting: Cardiovascular Disease

## 2023-01-16 ENCOUNTER — Other Ambulatory Visit (HOSPITAL_COMMUNITY): Payer: Self-pay | Admitting: Physician Assistant

## 2023-01-16 DIAGNOSIS — M79605 Pain in left leg: Secondary | ICD-10-CM | POA: Diagnosis present

## 2023-01-17 ENCOUNTER — Emergency Department (HOSPITAL_COMMUNITY): Payer: Managed Care, Other (non HMO)

## 2023-01-17 ENCOUNTER — Encounter (HOSPITAL_COMMUNITY): Payer: Self-pay

## 2023-01-17 ENCOUNTER — Other Ambulatory Visit: Payer: Self-pay

## 2023-01-17 ENCOUNTER — Emergency Department (HOSPITAL_COMMUNITY)
Admission: EM | Admit: 2023-01-17 | Discharge: 2023-01-17 | Disposition: A | Discharge status: Home / Self Care | Payer: Managed Care, Other (non HMO) | Attending: Emergency Medicine | Admitting: Emergency Medicine

## 2023-01-17 DIAGNOSIS — M25561 Pain in right knee: Secondary | ICD-10-CM

## 2023-01-17 MED ORDER — HYDROCODONE-ACETAMINOPHEN 5-325 MG PO TABS
1.0000 | ORAL_TABLET | Freq: Once | ORAL | Status: AC
  Administered 2023-01-17: 1 via ORAL
  Filled 2023-01-17: qty 1

## 2023-01-17 NOTE — ED Provider Notes (Signed)
[facility] Provider Note   CSN: [id number] Arrival date & time: 01/17/23  1416     History  Chief Complaint  Patient presents with   Knee Pain    Oscar Church is a 62 y.o. male, no pertinent past medical history, who presents to the ED secondary to right knee pain has been going on for the last week, but has been worse for the last couple days.  States that last couple days ago, he felt a pop and now feels like he cannot walk.  Recently evaluated for a DVT, yesterday by his primary care doctor, ultrasound was negative and got worse so he came to the ER.  Denies any trauma, states that it feels like is worse in the back of his knee.  Denies any history of PAD. No history of blood clots.  Pain is worse when it is pressed on, and when he bears weight.    Home Medications Prior to Admission medications   Medication Sig Start Date End Date Taking? Authorizing Provider  Bioflavonoid Products (SUPER-C 1000 PO) Take by mouth daily.    [provider]  fenofibrate 160 MG tablet Take 160 mg by mouth every morning.     [provider]  simvastatin (ZOCOR) 20 MG tablet Take 40 mg by mouth at bedtime.    [provider]  traMADol (ULTRAM) 50 MG tablet Take 1 tablet (50 mg total) by mouth every 6 (six) hours as needed. 04/17/22 04/17/23  Irine Seal, MD      Allergies    Patient has no known allergies.    Review of Systems   Review of Systems  Musculoskeletal:        +knee pain  Skin:  Negative for color change.    Physical Exam Updated Vital Signs BP 134/82 (BP Location: Left Arm)   Pulse 64   Temp 97.7 F (36.5 C) (Oral)   Resp 18   Ht 5\' 7"  (1.702 m)   Wt 92.1 kg   SpO2 96%   BMI 31.79 kg/m  Physical Exam Vitals and nursing note reviewed.  Constitutional:      General: He is not in acute distress.    Appearance: He is well-developed.  HENT:     Head: Normocephalic and atraumatic.  Eyes:      General:        Right eye: No discharge.        Left eye: No discharge.     Conjunctiva/sclera: Conjunctivae normal.  Pulmonary:     Effort: No respiratory distress.  Musculoskeletal:     Comments: Right Knee: Tenderness to palpation of posterior knee. A Ilario Dhaliwal effusion is present.  Negative anterior and posterior drawer. +Patellar stability. Negative valgus and varus stress test.. Extension and flexion intact. No sensory deficits.  Positive dorsalis pedis pulse.   Neurological:     Mental Status: He is alert.     Comments: Clear speech.   Psychiatric:        Behavior: Behavior normal.        Thought Content: Thought content normal.     ED Results / Procedures / Treatments   Labs (all labs ordered are listed, but only abnormal results are displayed) Labs Reviewed - No data to display  EKG None  Radiology DG Knee Complete 4 Views Right  Result Date: 01/17/2023 CLINICAL DATA:  Right knee pain and calf tightness. Today felt a pop in back of right knee severe pain.  EXAM: RIGHT KNEE - COMPLETE 4+ VIEW COMPARISON:  None Available. FINDINGS: Normal bone mineralization. Moderate patella alta. Borderline mild joint effusion. Mild chronic enthesopathic changes at the quadriceps and patellar tendon insertions on the patella. Findings basilar preserved. No acute fracture or dislocation. IMPRESSION: 1. Moderate patella alta. 2. Borderline mild joint effusion. Electronically Signed   By: Yvonne Kendall M.D.   On: 01/17/2023 15:50   VAS [place] LOWER EXTREMITY VENOUS (DVT)  Result Date: 01/16/2023  Lower Venous DVT Study Patient Name:  Oscar Church  Date of Exam:   01/16/2023 Medical Rec #: [id number]         Accession #:    [id number] Date of Birth: [birth year]          Patient Gender: M Patient Age:   62 years Exam Location:  [facility] Procedure:      VAS [place] LOWER EXTREMITY VENOUS (DVT) Referring Phys: CALEB TURMEL --------------------------------------------------------------------------------   Indications: Patient fell on right knee 11/17/2022. He indicates he has had right calf pain and pressure when standing up for the last couple days. He denies chest pain and SOB.  Comparison Study: None Performing Technologist: Alecia Mackin RVT, RDCS (AE), RDMS  Examination Guidelines: A complete evaluation includes B-mode imaging, spectral Doppler, color Doppler, and power Doppler as needed of all accessible portions of each vessel. Bilateral testing is considered an integral part of a complete examination. Limited examinations for reoccurring indications may be performed as noted. The reflux portion of the exam is performed with the patient in reverse Trendelenburg.  +---------+---------------+---------+-----------+----------+--------------+ RIGHT    CompressibilityPhasicitySpontaneityPropertiesThrombus Aging +---------+---------------+---------+-----------+----------+--------------+ CFV      Full           Yes      Yes                                 +---------+---------------+---------+-----------+----------+--------------+ SFJ      Full           Yes      Yes                                 +---------+---------------+---------+-----------+----------+--------------+ FV Prox  Full           Yes      Yes                                 +---------+---------------+---------+-----------+----------+--------------+ FV Mid   Full           Yes      Yes                                 +---------+---------------+---------+-----------+----------+--------------+ FV DistalFull           Yes      Yes                                 +---------+---------------+---------+-----------+----------+--------------+ PFV      Full                                                        +---------+---------------+---------+-----------+----------+--------------+  POP      Full           Yes      Yes                                  +---------+---------------+---------+-----------+----------+--------------+ PTV      Full           Yes      Yes                                 +---------+---------------+---------+-----------+----------+--------------+ PERO     Full           Yes      Yes                                 +---------+---------------+---------+-----------+----------+--------------+ Gastroc  Full                                                        +---------+---------------+---------+-----------+----------+--------------+ GSV      Full           Yes      Yes                                 +---------+---------------+---------+-----------+----------+--------------+   +----+---------------+---------+-----------+----------+--------------+ LEFTCompressibilityPhasicitySpontaneityPropertiesThrombus Aging +----+---------------+---------+-----------+----------+--------------+ CFV Full           Yes      Yes                                 +----+---------------+---------+-----------+----------+--------------+   Findings reported to [employer], PA at 11:30 am.  Summary: RIGHT: - No evidence of deep vein thrombosis in the lower extremity. No indirect evidence of obstruction proximal to the inguinal ligament. - No cystic structure found in the popliteal fossa. - Moderate suprapatellar fluid seen.  LEFT: - No evidence of common femoral vein obstruction.  *See table(s) above for measurements and observations. Electronically signed by Harold Barban MD on 01/16/2023 at 6:56:20 PM.    Final     Procedures Procedures    Medications Ordered in ED Medications  HYDROcodone-acetaminophen (NORCO/VICODIN) 5-325 MG per tablet 1 tablet (1 tablet Oral Given 01/17/23 1452)    ED Course/ Medical Decision Making/ A&P                             Medical Decision Making Patient is here for knee pain, states that it has been bad for the last week, worse the last couple days especially last day.  Negative  ultrasound outpatient, for DVT.  On exam he has tenderness to palpation of his posterior knee, and some tenderness with range of motion especially when pressing on it.  He has good dorsalis pedis pulse, and there are no color changes to his leg, I suspect this is possible ligamentous in nature.  Will obtain x-ray to evaluate for possible bony abnormality, and likely place in knee immobilizer.  Amount and/or Complexity of Data Reviewed Radiology: ordered.  Details: No acute findings Discussion of management or test interpretation with external provider(s): Discussed with patient, no acute findings on x-ray, he does have arthritic changes, however given his inability to walk, I believe that this is more ligamentous in nature, and I placed him in a knee immobilizer, and discharged him home.  He has crutches at home, thus he declined them.  Encouraged close follow-up with orthopedics.  Return precautions emphasized.  Tylenol for pain.  Risk Prescription drug management.   Final Clinical Impression(s) / ED Diagnoses Final diagnoses:  Acute pain of right knee    Rx / DC Orders ED Discharge Orders     None         Diamantina Monks, Si Gaul, PA 01/17/23 1924    Davonna Belling, MD 01/17/23 2320

## 2023-01-17 NOTE — Progress Notes (Signed)
Orthopedic Tech Progress Note Patient Details:  Oscar Church [birth year] [id number]  Knee immobilizer placed to RLE. Pt has crutches at home and did not want a new pair.   Ortho Devices Type of Ortho Device: Knee Immobilizer Ortho Device/Splint Location: RLE Ortho Device/Splint Interventions: Ordered, Application, Adjustment   Post Interventions Patient Tolerated: Fair Instructions Provided: Care of device, Adjustment of device  Jashon Ishida Jeri Modena 01/17/2023, 6:33 PM

## 2023-01-17 NOTE — ED Notes (Signed)
Ortho tech at bedside applying knee immobilizer. 

## 2023-01-17 NOTE — Discharge Instructions (Signed)
Please use ice, knee immobilizer, Tylenol, and crutches to help with your pain.  Please follow-up with orthopedic doctor.  If you have worsening pain coolness of your leg, please return to the ER.

## 2023-01-17 NOTE — ED Triage Notes (Signed)
Pt c.o right knee pain for the past week, seen at PCP yesterday, had an US done that was negative for DVT. Pt was at work today and heard a "pop". Pt able to transfer from stretcher to [facility].

## 2023-01-17 NOTE — ED Provider Triage Note (Signed)
Emergency Medicine Provider Triage Evaluation Note  Oscar Church , a 62 y.o. male  was evaluated in triage.  Pt complains of right knee pain for last week, worse for last few days. Unable to bear weight. Felt pop yesterday and now pain is worse and can't walk at all. Had U/S yesterday that was negative.   Review of Systems  Positive: Knee pain Negative: swelling  Physical Exam  BP (!) 143/91   Pulse 71   Temp (!) 97.5 F (36.4 C) (Oral)   Resp 20   Ht 5\' 7"  (1.702 m)   Wt 92.1 kg   SpO2 99%   BMI 31.79 kg/m  Gen:   Awake, no distress   Resp:  Normal effort  MSK:   Moves extremities without difficulty  Other:  +posterior knee pain, no edema  Medical Decision Making  Medically screening exam initiated at 2:40 PM.  Appropriate orders placed.  Oscar Church was informed that the remainder of the evaluation will be completed by another provider, this initial triage assessment does not replace that evaluation, and the importance of remaining in the ED until their evaluation is complete.     Osvaldo Shipper, [place] 01/17/23 1446

## 2024-03-25 ENCOUNTER — Other Ambulatory Visit: Payer: Self-pay | Admitting: Urology

## 2024-04-13 HISTORY — PX: MENISCUS REPAIR: SHX5179

## 2024-04-28 ENCOUNTER — Encounter (HOSPITAL_COMMUNITY): Payer: Self-pay | Admitting: Urology

## 2024-04-28 NOTE — Progress Notes (Signed)
 Spoke w/ via phone for pre-op interview---[facility] needs dos----  BMP and EKG per anesthesia.        Lab results------ COVID test -----patient states asymptomatic no test needed Arrive at -------0530 NPO after MN NO Solid Food.   Pre-Surgery Ensure or G2:  Med rec completed Medications to take morning of surgery ----- NONE Diabetic medication -----  GLP1 agonist last dose: GLP1 instructions:  Patient instructed no nail polish to be worn day of surgery Patient instructed to bring photo id and insurance card day of surgery Patient aware to have Driver (ride ) / caregiver    for 24 hours after surgery - Dina Mauck-Wife Patient Special Instructions ----- shower with antibacterial soap. Pt had recent knee surgery, (04/13/24) has been taking ASA 81mg  since then, pt to check with Dr Durell office about holding prior to procedure. Pre-Op special Instructions -----  Patient verbalized understanding of instructions that were given at this phone interview. Patient denies chest pain, sob, fever, cough at the interview.

## 2024-05-04 ENCOUNTER — Encounter (HOSPITAL_COMMUNITY): Payer: Self-pay | Admitting: Urology

## 2024-05-04 NOTE — Anesthesia Preprocedure Evaluation (Signed)
 Anesthesia Evaluation  Patient identified by MRN, date of birth, ID band Patient awake    Reviewed: Allergy & Precautions, NPO status , Patient's Chart, lab work & pertinent test results  Airway Mallampati: II  TM Distance: >3 FB     Dental  (+) Caps, Dental Advisory Given, Teeth Intact   Pulmonary former smoker   Pulmonary exam normal breath sounds clear to auscultation       Cardiovascular hypertension, Pt. on medications Normal cardiovascular exam Rhythm:Regular Rate:Normal  EKG 05/05/24 NSR, Inferior MI   Neuro/Psych negative neurological ROS  negative psych ROS   GI/Hepatic negative GI ROS, Neg liver ROS,,,  Endo/Other  HLD Obesity  Renal/GU Renal InsufficiencyRenal disease   Bladder Ca    Musculoskeletal negative musculoskeletal ROS (+)    Abdominal  (+) + obese  Peds  Hematology negative hematology ROS (+)   Anesthesia Other Findings   Reproductive/Obstetrics                              Anesthesia Physical Anesthesia Plan  ASA: 2  Anesthesia Plan: General   Post-op Pain Management: Minimal or no pain anticipated, Dilaudid  IV, Precedex  and Tylenol  PO (pre-op)*   Induction: Intravenous  PONV Risk Score and Plan: 4 or greater and Ondansetron  and Dexamethasone   Airway Management Planned: LMA and Oral ETT  Additional Equipment: None  Intra-op Plan:   Post-operative Plan: Extubation in OR  Informed Consent: I have reviewed the patients History and Physical, chart, labs and discussed the procedure including the risks, benefits and alternatives for the proposed anesthesia with the patient or authorized representative who has indicated his/her understanding and acceptance.     Dental advisory given  Plan Discussed with: CRNA and Anesthesiologist  Anesthesia Plan Comments:          Anesthesia Quick Evaluation

## 2024-05-04 NOTE — H&P (Signed)
 I have bladder cancer that has been treated.  HPI: Oscar Church is a 63 year-old male established patient who is here for follow-up of bladder cancer treatment.  His bladder cancer was superficial and limitied to the bladder lining. His bladder cancer was not muscle invasive.   He did have a TURBT. His last bladder tumor was resected 04/17/2022.   He has not had blood in his urine recently.   His last cysto was 07/30/2019.   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 He is for cystoscopy with TURBT on 05/05/24. He had area of concern on his recent cystoscopy that could be treatment effect or BCG. His cytology was negative. His UA today has 20-40 RBC's. He is on a baby ASA following a meniscus repair on the right knee 2 weeks ago. He has has nocturia x 2 which is better. He never took the oxybutynin.   03/25/24: Oscar Church returns today in f/u for surveillance cystoscopy. He last had BCG maintenance on 12/24/23 for his history of CIS last detected in 6/23. He has some increased nocturia q1.5hr with hesitancy since the last BCG. He has some daytime urgency but no UUI. He has had no hematuria. His IPSS is 9 with nocturia x 3-4.   11/04/23: Oscar Church returns today for cystoscopy. His IPSS is 2 with nocturia 1-2x. His UA is clear and he has had no hematuria. His last positive biopsy was with CIS in 6/23. He had BCG maintenance last on 07/31/23.   06/26/23: Oscar Church returns today in f/u for his history of an elevated PSA and bladder cancer. His PSA is 4.24 with 19% f/t ratio. This is down from 9.2 which was felt to be up from the BCG. He last had CIS on biopsy in 6/23 and remains on BCG maintenance. His UA today is clear. His IPSS is 1 with nocturia. He has had no hematuria.   02/06/23: Oscar Church returns today in f/u. He was in the ER a couple of weeks ago with a possible rupture of a right Baker's cyst. He got course of steroids. He is voiding well with an IPSS of 3. He has some urgency and nocturia. His UA has 3-10  RBC's. His PSA is up to 9.2 on 4/3 but he last got BCG on 12/26/22 but he doesn't report much of a reaction other than hematuria. He had a negative prostate biopsy in 2020 for a PSA of 6.7. 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   11/26/22: Oscar Church returns today in f/u for his history of bladder cancer. He last had a TURBT in 6/23 and completed BCG induction in 9/23. He has no hematuria and his UA is clear today. his IPSS is 3 with nocturia x 1.   08/22/22: Oscar Church returns today in f/u for his history of bladder cancer. He has completed the BCG induction on 07/20/22 and still has some urgency. He has had no hematuria. his UA is clear today. His IPSS is 5 with nocturia x 1 and urgency.   04/25/22: Oscar Church returns today in f/u from his history of bladder cancer and a biopsy on 04/17/22. He was found to have CIS in the area of the left trigone surrounding the ureteral orifice. He had no evidence of disease in the distal ureter on ureteroscopy. He is doing well s/p the biopsy with an IPSS of 1. His UA has 10-20 RBC's.   03/14/22: Oscar Church returns today in f/u for his history of bladder cancer. He last  had a biopsy on 12/22 of a bladder lesion on the dome and it showed early papillary formation. he has a his history of low grade non-invasive urothelial CA. He completed BCG on 11/10/18. He is doing well resolution of his LUTS. Cytology had atypia but the FISH was negative in12/22. His PSA went up with the BCG and a prostate biopsy in 7/20 was negative. The PSA is back down to 3.62 prior to this visit. he is doing well without hematuria or dysuria. His IPSS is 1. His UA is clear. He reports some darkness of the semen.   He had a TURBT on 11/28/17 for possible recurrence. His path just showed chronic inflammation. He had an initial TURBT on 04/30/17. He had several abnormal lesions in the bladder with biopsies from the posterior wall and bladder neck showing atypia and a lesion from the LL wall being LG NMIBC.     AUA  Symptom Score: He never has the sensation of not emptying his bladder completely after finishing urinating. Less than 20% of the time he has to urinate again fewer than two hours after he has finished urinating. Less than 20% of the time he has to start and stop again several times when he urinates. Less than 20% of the time he finds it difficult to postpone urination. He never has a weak urinary stream. Less than 50% of the time he has to push or strain to begin urination. He has to get up to urinate 4 times from the time he goes to bed until the time he gets up in the morning.   Calculated AUA Symptom Score: 9    ALLERGIES: No Allergies    MEDICATIONS: Lisinopril 20 MG Tablet  Simvastatin 20 MG Tablet  Aspirin Childrens 81 MG Tablet Chewable  Diclofenac Sodium  Ezetimibe  Fenofibrate  Multivitamin     GU PSH: Bladder Instill AntiCA Agent - 12/24/2023, 12/12/2023, 12/05/2023, 07/31/2023, 07/24/2023, 07/17/2023, 12/26/2022, 12/19/2022, 12/12/2022, 07/20/2022, 07/13/2022, 07/06/2022, 06/29/2022, 06/22/2022, 06/15/2022, 2020, 2020, 2019, 2019, 2019, 2019 Cystoscopy - 03/25/2024, 11/04/2023, 06/26/2023, 02/06/2023, 11/26/2022, 08/22/2022, 2023, 2022, 2021, 2021, 2020, 2020, 2020, 2019, 2019, 2018, 2018 Cystoscopy Fulguration - 2019, 2018 Cystoscopy TURBT <2 cm - 2023, 2020 Cystoscopy TURBT 2-5 cm - 2022, 2019, 2018 Cystoscopy Ureteroscopy - 2023 Locm 300-399Mg /Ml Iodine,1Ml - 04/30/2022, 2018 Prostate Needle Biopsy - 2020     NON-GU PSH: Surgical Pathology, Gross And Microscopic Examination For Prostate Needle - 2020     GU PMH: History of bladder cancer, He has some changes on the bladder wall that 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 I will send a cytology today but he will need cystoscopy with RTG's and TUR of the lesions. Risks reviewed in detail. He has left knee arthroscopy on 6/19 and may have the other side done in late July. I will try to get him in between knees. - 03/25/2024, No recurrence. Cytology  today. f/u in 3 months for cystoscopy and subsequent BCG maintenance, - 11/04/2023, No evidence of recurrent disease. Cytology sent. He will return for 3 weeks of maintenance BCG and then cystoscopy in 3 months. , - 06/26/2023, He has no recurrent tumors but has some erythema that is consistent with treatment effect which is not surprising 5-6 weeks out from BCG. Cystoscopy in 3 months. , - 02/06/2023, No recurrent tumors seen. He will have BCG maintenance x 3 weeks and cysto in 3 months. , - 11/26/2022, - 07/06/2022, He has a small lesion on the dome adjacent to his  prior resection/biopsy site that is concerniing for a recurrence. I have recommended a TURBT with possible gemcitabine . I have reivewed the risks of bleeding, infection, bladder injury, chemical cystitis, thrombotic events and anesthetic complications. He has a tiny bladder stone I will remove as well. , - 2023, He has no obvious recurrent tumors but does have some worsening erythema on the dome in the area of his prior biopsy with dysplasia. I am going to get him set up for cystoscopy with bil RTG's and biopsy. risks reviewed in detail. , - 2022, He has no recurrent tumors but does have stable erythema on the posterior wall. I will get a cytology and if negative, have him return in 1 year for CT hematuria study and cystoscopy. , - 2021, His repeat resection was benign. He will return in 6 months for cystoscopy. , - 2019, He has persistent post op edema and inflammation particularly on the left with associated urgency and dysuria. I am going to given him Uribel and will repeat cystoscopy in 3 months. If the lesion persists at that time, he will need reresection. , - 2018 Nocturia, He has nocturia and urgency since the last BCG in February. I will give him oxybutynin 5mg  nightly. Side effects reviewed. - 03/25/2024 Urinary Urgency (Stable) - 03/25/2024, - 11/04/2023 (Stable), - 08/22/2022, I am going to give him Oxybutynin ER 5mg  daily. I have reviewed the side  effects. , - 2018 Bladder Cancer overlapping sites - 12/24/2023, - 12/19/2023, - 12/12/2023, - 12/05/2023, - 07/31/2023, - 07/24/2023, - 07/17/2023, - 12/26/2022, - 12/19/2022, - 12/12/2022, - 07/20/2022, - 07/13/2022, - 06/29/2022, - 06/22/2022, - 06/15/2022, - 04/30/2022, - 2020 (Worsening), He has a possible recurrence on the left trigone and BN. Urine cytology today. I will set him up for a TURBT. I have reviewed the risks including bleeding, infection, ureteral and bladder injury, need for secondary procedures, thrombotic events and anesthetic complications. , - 2019, He had LG NMIBC with patchy areas of atypia vs reactive disease. I am going to have him return in 3 months for cystoscopy with a cytology prior to the visit. I don't see a need for BCG at this time. , - 2018 BPH w/LUTS, He has mild LUTS. I will repeat a PSA at f/u for his history of an elevated PSA. - 11/04/2023, He has urgency but that is improving and he has only mild LUTs. , - 08/22/2022, He has mild LUTS with some intermittency. , - 2020 Elevated PSA, His PSA is back down nicely following recovery from the BCG. - 06/26/2023, His PSA is up but it was done only a month out from his last BCG treatment. I will repeat in 4-6 weeks. It has gone up with BCG in the past. , - 02/06/2023, PSA in 3 months. , - 11/26/2022, His PSA has come back down and will need a repeat in a year. , - 2023, I will repeat the PSA in 6 months. , - 2022, His PSA is up slightly but no enough to merit a biopsy at this time. , - 2022, PSA in a year., - 2021, - 2020, - 2020 (Worsening), PSA is up to 4.3 but he finished BCG in 1/20. I will repeat the PSA and do an exam in 3 months. , - 2020 (Improving), His PSA has fallen further. He will need a repeat in a year. , - 2019, His PSA has been falling over the last several months so I will continue to watch it with a repeat  in 6 months. , - 2019, He reports that Dr. Austin told him his PSA is up. I have requested that result and will repeat the PSA with a  week of abstinence in 3 months. The elevation is probably from his recent instrumentation., - 2018 Microscopic hematuria, 3-10 RBC's which is stable. - 02/06/2023 CIS of the bladder, He has some residual erythema adjacent to the prior biopsy sites that is most consistent with treatment effect. I will get a cytology and have him return in 3 months for cystoscopy. He would need maintenance if clear at 3 months. - 08/22/2022, He has recurred with CIS and will need BCG induction again followed by maintenance therapy. He is aware of the risks. , - 2023 Bladder tumor/neoplasm, Small lesion on the dome with some papillary changes. - 2023, He had urothelial hyperplasia on his recent biopsy. He had dysplasia at last biopsy. He will return for cystoscopy in 6 months. , - 2022, He has some progression of the lesion on the dome. , - 2022, - 2020, He has a bladder lesion on the right bladder base that is worrisome for CIS. I will get a cytology today and get him set up for a cystoscopy with biopsy and fulguration. Risks of bleeding, infection, bladder injury, thrombotic events and anesthetic complications reviewed. , - 2018 BPH w/o LUTS, He has BPH with BOO on cystoscopy but minimal LUTS. - 2021, He is voiding well and his exam remains benign but the PSA is up further. I believe we need to do a prostate US  and biopsy. 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 He is going to need induction BCG with 1 year of maintenance. I will repeat a PSA prior to his next cystoscopy in about 4 months. BCG procedure and side effects reviewed in detail. - 2019 Gross hematuria - 2018, He had single episode of gross hematuria with a negative CT. He will be set up to return for cystoscopy., - 2018    NON-GU PMH: Hypercholesterolemia    FAMILY HISTORY: 2 daughters - Other 1 son - Other nephrolithiasis - No Family  History   SOCIAL HISTORY: Marital Status: Married Preferred Language: English; Race: White Current Smoking Status: Patient smokes.   Tobacco Use Assessment Completed: Used Tobacco in last 30 days? Drinks 2 caffeinated drinks per day.    REVIEW OF SYSTEMS:    GU Review Male:   Patient denies frequent urination, hard to postpone urination, burning/ pain with urination, get up at night to urinate, leakage of urine, stream starts and stops, trouble starting your stream, have to strain to urinate , erection problems, and penile pain.  Gastrointestinal (Upper):   Patient denies nausea, vomiting, and indigestion/ heartburn.  Gastrointestinal (Lower):   Patient denies diarrhea and constipation.  Constitutional:   Patient denies fever, night sweats, weight loss, and fatigue.  Skin:   Patient denies skin rash/ lesion and itching.  Eyes:   Patient denies blurred vision and double vision.  Ears/ Nose/ Throat:   Patient denies sore throat and sinus problems.  Hematologic/Lymphatic:   Patient denies swollen glands and easy bruising.  Cardiovascular:   Patient denies leg swelling and chest pains.  Respiratory:   Patient denies cough and shortness of breath.  Endocrine:   Patient denies excessive thirst.  Musculoskeletal:   Patient denies back pain and joint pain.  Neurological:   Patient denies headaches  and dizziness.  Psychologic:   Patient denies depression and anxiety.   VITAL SIGNS:      04/29/2024 02:23 PM  Weight 205 lb / 92.99 kg  Height 67 in / 170.18 cm  BP 135/89 mmHg  Heart Rate 67 /min  Temperature 97.3 F / 36.2 C  BMI 32.1 kg/m   MULTI-SYSTEM PHYSICAL EXAMINATION:    Constitutional: Well-nourished. No physical deformities. Normally developed. Good grooming.  Respiratory: Normal breath sounds. No labored breathing, no use of accessory muscles.   Cardiovascular: Regular rate and rhythm. No murmur, no gallop. Normal temperature, normal extremity pulses, no swelling, no  varicosities.      Complexity of Data:  Lab Test Review:   Urine Cytology  Records Review:   Previous Patient Records  Urine Test Review:   Urinalysis   06/19/23 01/30/23 03/07/22 09/06/21 02/08/20 04/16/19 01/07/19 08/14/18  PSA  Total PSA 4.24 ng/mL 9.20 ng/mL 3.62 ng/mL 5.24 ng/mL 4.75 ng/mL 6.94 ng/mL 4.30 ng/mL 2.73 ng/mL  Free PSA 0.80 ng/mL 1.30 ng/mL  0.80 ng/mL 0.76 ng/mL 0.93 ng/mL 0.83 ng/mL   % Free PSA 19 % PSA 14 % PSA  15 % PSA 16 % PSA 13 % PSA 19 % PSA     PROCEDURES:          Visit Complexity - G2211 Chronic management         Urinalysis w/Scope Dipstick Dipstick Cont'd Micro  Color: Yellow Bilirubin: Neg mg/dL WBC/hpf: NS (Not Seen)  Appearance: Clear Ketones: Neg mg/dL RBC/hpf: 20 - 59/yeq  Specific Gravity: 1.025 Blood: 3+ ery/uL Bacteria: NS (Not Seen)  pH: <=5.0 Protein: Neg mg/dL Cystals: NS (Not Seen)  Glucose: Neg mg/dL Urobilinogen: 0.2 mg/dL Casts: NS (Not Seen)    Nitrites: Neg Trichomonas: Not Present    Leukocyte Esterase: Neg leu/uL Mucous: Present      Epithelial Cells: NS (Not Seen)      Yeast: NS (Not Seen)      Sperm: Not Present    ASSESSMENT:      ICD-10 Details  1 GU:   History of bladder cancer - Z85.51 Chronic, Stable - He has microhematuria today but no other issues. I will have him hold the bASA prior to the procedure for 3 days. I reviewed the risks again.   2   Microscopic hematuria - R31.21 Minor  3   BPH w/LUTS - N40.1 Chronic, Stable - He never took the oxybutynin but the nocturia has improved.   4   Nocturia - R35.1 Chronic, Stable, Improving   PLAN:            Medications Stop Meds: oxyBUTYnin Chloride 5 MG Tablet 1 tablet PO Q HS  Start: 04/28/2024  Stop: 10/25/2024   Discontinue: 04/29/2024  - Reason: The medication cycle was completed.            Schedule Return Visit/Planned Activity: Keep Scheduled Appointment          Document Letter(s):  Created for Patient: Clinical Summary         Next Appointment:       Next Appointment: 05/05/2024 07:30 AM    Appointment Type: Surgery     Location: [facility] [phone]    Provider: Norleen Seltzer, M.D.    Reason for Visit: OP--CONE---CY, BIL RTG, TURBT $203.80 due 07/01

## 2024-05-05 ENCOUNTER — Encounter (HOSPITAL_COMMUNITY)
Admission: RE | Disposition: A | Discharge status: Home / Self Care | Payer: Self-pay | Source: Home / Self Care | Attending: Urology

## 2024-05-05 ENCOUNTER — Ambulatory Visit (HOSPITAL_COMMUNITY)
Admission: RE | Admit: 2024-05-05 | Discharge: 2024-05-05 | Disposition: A | Discharge status: Home / Self Care | Attending: Urology | Admitting: Urology

## 2024-05-05 ENCOUNTER — Encounter (HOSPITAL_COMMUNITY): Payer: Self-pay | Admitting: Urology

## 2024-05-05 ENCOUNTER — Ambulatory Visit (HOSPITAL_COMMUNITY)

## 2024-05-05 ENCOUNTER — Ambulatory Visit (HOSPITAL_COMMUNITY): Payer: Self-pay | Admitting: Anesthesiology

## 2024-05-05 ENCOUNTER — Ambulatory Visit (HOSPITAL_BASED_OUTPATIENT_CLINIC_OR_DEPARTMENT_OTHER): Payer: Self-pay | Admitting: Anesthesiology

## 2024-05-05 DIAGNOSIS — I252 Old myocardial infarction: Secondary | ICD-10-CM | POA: Insufficient documentation

## 2024-05-05 DIAGNOSIS — R3129 Other microscopic hematuria: Secondary | ICD-10-CM | POA: Diagnosis not present

## 2024-05-05 DIAGNOSIS — F17200 Nicotine dependence, unspecified, uncomplicated: Secondary | ICD-10-CM | POA: Insufficient documentation

## 2024-05-05 DIAGNOSIS — N401 Enlarged prostate with lower urinary tract symptoms: Secondary | ICD-10-CM | POA: Diagnosis not present

## 2024-05-05 DIAGNOSIS — R351 Nocturia: Secondary | ICD-10-CM | POA: Insufficient documentation

## 2024-05-05 DIAGNOSIS — N329 Bladder disorder, unspecified: Secondary | ICD-10-CM | POA: Diagnosis not present

## 2024-05-05 DIAGNOSIS — E785 Hyperlipidemia, unspecified: Secondary | ICD-10-CM | POA: Diagnosis not present

## 2024-05-05 DIAGNOSIS — Z6831 Body mass index (BMI) 31.0-31.9, adult: Secondary | ICD-10-CM | POA: Insufficient documentation

## 2024-05-05 DIAGNOSIS — I10 Essential (primary) hypertension: Secondary | ICD-10-CM | POA: Insufficient documentation

## 2024-05-05 DIAGNOSIS — Z8551 Personal history of malignant neoplasm of bladder: Secondary | ICD-10-CM | POA: Diagnosis not present

## 2024-05-05 DIAGNOSIS — E669 Obesity, unspecified: Secondary | ICD-10-CM | POA: Insufficient documentation

## 2024-05-05 DIAGNOSIS — N3081 Other cystitis with hematuria: Secondary | ICD-10-CM | POA: Diagnosis not present

## 2024-05-05 DIAGNOSIS — N3080 Other cystitis without hematuria: Secondary | ICD-10-CM | POA: Diagnosis present

## 2024-05-05 HISTORY — PX: CYSTOSCOPY W/ RETROGRADES: SHX1426

## 2024-05-05 HISTORY — DX: Essential (primary) hypertension: I10

## 2024-05-05 HISTORY — PX: TRANSURETHRAL RESECTION OF BLADDER TUMOR: SHX2575

## 2024-05-05 LAB — BASIC METABOLIC PANEL WITH GFR
Anion gap: 9 (ref 5–15)
BUN: 24 mg/dL — ABNORMAL HIGH (ref 8–23)
CO2: 20 mmol/L — ABNORMAL LOW (ref 22–32)
Calcium: 9.5 mg/dL (ref 8.9–10.3)
Chloride: 110 mmol/L (ref 98–111)
Creatinine, Ser: 1.29 mg/dL — ABNORMAL HIGH (ref 0.61–1.24)
GFR, Estimated: >60 mL/min (ref >60)
Glucose, Bld: 106 mg/dL — ABNORMAL HIGH (ref 70–99)
Potassium: 4.2 mmol/L (ref 3.5–5.1)
Sodium: 139 mmol/L (ref 135–145)

## 2024-05-05 SURGERY — TURBT (TRANSURETHRAL RESECTION OF BLADDER TUMOR)
Anesthesia: General | Site: Bladder

## 2024-05-05 MED ORDER — LIDOCAINE HCL URETHRAL/MUCOSAL 2 % EX GEL
CUTANEOUS | Status: AC
  Filled 2024-05-05: qty 11

## 2024-05-05 MED ORDER — CEFAZOLIN SODIUM-DEXTROSE 2-4 GM/100ML-% IV SOLN
2.0000 g | INTRAVENOUS | Status: AC
  Administered 2024-05-05: 2 g via INTRAVENOUS

## 2024-05-05 MED ORDER — OXYCODONE HCL 5 MG PO TABS: 5.0000 mg | ORAL_TABLET | Freq: Once | ORAL | Status: DC | PRN

## 2024-05-05 MED ORDER — DEXAMETHASONE SODIUM PHOSPHATE 10 MG/ML IJ SOLN
INTRAMUSCULAR | Status: AC
Start: 2024-05-05 — End: 2024-05-05
  Filled 2024-05-05: qty 1

## 2024-05-05 MED ORDER — HYDROMORPHONE HCL 1 MG/ML IJ SOLN
INTRAMUSCULAR | Status: AC
  Filled 2024-05-05: qty 0.5

## 2024-05-05 MED ORDER — PHENYLEPHRINE 80 MCG/ML (10ML) SYRINGE FOR IV PUSH (FOR BLOOD PRESSURE SUPPORT)
PREFILLED_SYRINGE | INTRAVENOUS | Status: DC | PRN
  Administered 2024-05-05: 80 ug via INTRAVENOUS

## 2024-05-05 MED ORDER — PHENYLEPHRINE 80 MCG/ML (10ML) SYRINGE FOR IV PUSH (FOR BLOOD PRESSURE SUPPORT)
PREFILLED_SYRINGE | INTRAVENOUS | Status: AC
Start: 2024-05-05 — End: 2024-05-05
  Filled 2024-05-05: qty 10

## 2024-05-05 MED ORDER — LIDOCAINE 2% (20 MG/ML) 5 ML SYRINGE
INTRAMUSCULAR | Status: DC | PRN
Start: 2024-05-05 — End: 2024-05-05
  Administered 2024-05-05: 100 mg via INTRAVENOUS

## 2024-05-05 MED ORDER — HYDROMORPHONE HCL 1 MG/ML IJ SOLN: 0.2500 mg | INTRAMUSCULAR | Status: DC | PRN

## 2024-05-05 MED ORDER — TRAMADOL HCL 50 MG PO TABS: 50.0000 mg | ORAL_TABLET | Freq: Four times a day (QID) | ORAL | 0 refills | Status: AC | PRN

## 2024-05-05 MED ORDER — DEXMEDETOMIDINE HCL IN NACL 80 MCG/20ML IV SOLN
INTRAVENOUS | Status: DC | PRN
Start: 2024-05-05 — End: 2024-05-05
  Administered 2024-05-05: 8 ug via INTRAVENOUS
  Administered 2024-05-05: 12 ug via INTRAVENOUS

## 2024-05-05 MED ORDER — LIDOCAINE 2% (20 MG/ML) 5 ML SYRINGE
INTRAMUSCULAR | Status: AC
  Filled 2024-05-05: qty 5

## 2024-05-05 MED ORDER — SODIUM CHLORIDE 0.9 % IR SOLN
Status: DC | PRN
  Administered 2024-05-05: 3000 mL via INTRAVESICAL

## 2024-05-05 MED ORDER — MIDAZOLAM HCL 2 MG/2ML IJ SOLN
INTRAMUSCULAR | Status: DC | PRN
  Administered 2024-05-05: 2 mg via INTRAVENOUS

## 2024-05-05 MED ORDER — ONDANSETRON HCL 4 MG/2ML IJ SOLN
INTRAMUSCULAR | Status: AC
  Filled 2024-05-05: qty 2

## 2024-05-05 MED ORDER — CHLORHEXIDINE GLUCONATE 0.12 % MT SOLN
15.0000 mL | Freq: Once | OROMUCOSAL | Status: AC
  Administered 2024-05-05: 15 mL via OROMUCOSAL

## 2024-05-05 MED ORDER — LACTATED RINGERS IV SOLN: INTRAVENOUS | Status: DC

## 2024-05-05 MED ORDER — FENTANYL CITRATE (PF) 100 MCG/2ML IJ SOLN
INTRAMUSCULAR | Status: DC | PRN
  Administered 2024-05-05: 100 ug via INTRAVENOUS

## 2024-05-05 MED ORDER — DROPERIDOL 2.5 MG/ML IJ SOLN: 0.6250 mg | Freq: Once | INTRAMUSCULAR | Status: DC | PRN

## 2024-05-05 MED ORDER — CHLORHEXIDINE GLUCONATE 0.12 % MT SOLN
OROMUCOSAL | Status: AC
  Filled 2024-05-05: qty 15

## 2024-05-05 MED ORDER — DEXMEDETOMIDINE HCL IN NACL 80 MCG/20ML IV SOLN
INTRAVENOUS | Status: AC
  Filled 2024-05-05: qty 20

## 2024-05-05 MED ORDER — FENTANYL CITRATE (PF) 100 MCG/2ML IJ SOLN
INTRAMUSCULAR | Status: AC
  Filled 2024-05-05: qty 4

## 2024-05-05 MED ORDER — ONDANSETRON HCL 4 MG/2ML IJ SOLN: 4.0000 mg | Freq: Once | INTRAMUSCULAR | Status: DC | PRN

## 2024-05-05 MED ORDER — CEFAZOLIN SODIUM-DEXTROSE 2-4 GM/100ML-% IV SOLN
INTRAVENOUS | Status: AC
  Filled 2024-05-05: qty 100

## 2024-05-05 MED ORDER — ONDANSETRON HCL 4 MG/2ML IJ SOLN
INTRAMUSCULAR | Status: DC | PRN
  Administered 2024-05-05: 4 mg via INTRAVENOUS

## 2024-05-05 MED ORDER — SODIUM CHLORIDE 0.9% FLUSH: 3.0000 mL | Freq: Two times a day (BID) | INTRAVENOUS | Status: DC

## 2024-05-05 MED ORDER — SODIUM CHLORIDE (PF) 0.9 % IJ SOLN
INTRAMUSCULAR | Status: AC
  Filled 2024-05-05: qty 10

## 2024-05-05 MED ORDER — ORAL CARE MOUTH RINSE: 15.0000 mL | Freq: Once | OROMUCOSAL | Status: AC

## 2024-05-05 MED ORDER — MIDAZOLAM HCL 2 MG/2ML IJ SOLN
INTRAMUSCULAR | Status: AC
  Filled 2024-05-05: qty 2

## 2024-05-05 MED ORDER — OXYCODONE HCL 5 MG/5ML PO SOLN: 5.0000 mg | Freq: Once | ORAL | Status: DC | PRN

## 2024-05-05 MED ORDER — HYDROMORPHONE HCL 1 MG/ML IJ SOLN
INTRAMUSCULAR | Status: DC | PRN
  Administered 2024-05-05: .5 mg via INTRAVENOUS

## 2024-05-05 MED ORDER — DEXAMETHASONE SODIUM PHOSPHATE 10 MG/ML IJ SOLN
INTRAMUSCULAR | Status: DC | PRN
Start: 2024-05-05 — End: 2024-05-05
  Administered 2024-05-05: 10 mg via INTRAVENOUS

## 2024-05-05 MED ORDER — PROPOFOL 10 MG/ML IV BOLUS
INTRAVENOUS | Status: DC | PRN
  Administered 2024-05-05: 50 mg via INTRAVENOUS
  Administered 2024-05-05: 150 mg via INTRAVENOUS

## 2024-05-05 MED ORDER — PROPOFOL 10 MG/ML IV BOLUS
INTRAVENOUS | Status: AC
  Filled 2024-05-05: qty 20

## 2024-05-05 MED ORDER — IOHEXOL 300 MG/ML  SOLN
INTRAMUSCULAR | Status: DC | PRN
  Administered 2024-05-05: 10 mL via URETHRAL

## 2024-05-05 SURGICAL SUPPLY — 33 items
BAG URINE DRAIN 2000ML AR STRL (UROLOGICAL SUPPLIES) ×2 IMPLANT
BAG URO CATCHER STRL LF (MISCELLANEOUS) ×2 IMPLANT
BASKET ZERO TIP NITINOL 2.4FR (BASKET) IMPLANT
CATH FOLEY 2WAY SLVR 5CC 18FR (CATHETERS) IMPLANT
CATH FOLEY 3WAY 30CC 22FR (CATHETERS) IMPLANT
CATH HEMA 3WAY 30CC 22FR COUDE (CATHETERS) IMPLANT
CATH URET 5FR 70CM CONE TIP (BALLOONS) IMPLANT
CATH URETL OPEN 5X70 (CATHETERS) IMPLANT
ELECTRODE REM PT RTRN 9FT ADLT (ELECTROSURGICAL) IMPLANT
EXTRACTOR STONE 1.7FRX115CM (UROLOGICAL SUPPLIES) IMPLANT
GLOVE SURG SS PI 8.0 STRL IVOR (GLOVE) ×2 IMPLANT
GOWN STRL REUS W/ TWL XL LVL3 (GOWN DISPOSABLE) ×2 IMPLANT
GOWN STRL SURGICAL XL XLNG (GOWN DISPOSABLE) ×2 IMPLANT
GUIDEWIRE ANG ZIPWIRE 038X150 (WIRE) IMPLANT
GUIDEWIRE STR DUAL SENSOR (WIRE) ×2 IMPLANT
HOLDER FOLEY CATH W/STRAP (MISCELLANEOUS) ×2 IMPLANT
KIT TURNOVER KIT B (KITS) ×2 IMPLANT
LASER FIB FLEXIVA PULSE ID 365 (Laser) IMPLANT
LOOP CUT BIPOLAR 24F LRG (ELECTROSURGICAL) ×2 IMPLANT
MANIFOLD NEPTUNE II (INSTRUMENTS) ×2 IMPLANT
NS IRRIG 1000ML POUR BTL (IV SOLUTION) ×2 IMPLANT
NS IRRIG 500ML POUR BTL (IV SOLUTION) ×2 IMPLANT
PACK CYSTO (CUSTOM PROCEDURE TRAY) ×2 IMPLANT
SHEATH NAVIGATOR HD 11/13X28 (SHEATH) IMPLANT
SHEATH NAVIGATOR HD 12/14X46 (SHEATH) IMPLANT
SLEEVE SCD COMPRESS KNEE MED (STOCKING) ×2 IMPLANT
SOL .9 NS 3000ML IRR UROMATIC (IV SOLUTION) ×4 IMPLANT
SYR 30ML LL (SYRINGE) IMPLANT
SYRINGE TOOMEY IRRIG 70ML (MISCELLANEOUS) IMPLANT
TUBE CONNECTING 12X1/4 (SUCTIONS) ×2 IMPLANT
TUBING UROLOGY SET (TUBING) IMPLANT
WATER STERILE IRR 1000ML POUR (IV SOLUTION) ×2 IMPLANT
WATER STERILE IRR 3000ML UROMA (IV SOLUTION) IMPLANT

## 2024-05-05 NOTE — Transfer of Care (Signed)
 Immediate Anesthesia Transfer of Care Note  Patient: Oscar Church  Procedure(s) Performed: TURBT (TRANSURETHRAL RESECTION OF BLADDER TUMOR) (Bladder) CYSTOSCOPY, WITH RETROGRADE PYELOGRAM (Bilateral: Bladder)  Patient Location: PACU  Anesthesia Type:General  Level of Consciousness: oriented and patient cooperative  Airway & Oxygen Therapy: Patient connected to face mask oxygen  Post-op Assessment: Report given to RN, Post -op Vital signs reviewed and stable, Patient moving all extremities X 4, and Patient able to stick tongue midline  Post vital signs: Reviewed and stable  Last Vitals:  Vitals Value Taken Time  BP 124/96 05/05/24 08:37  Temp 36.6 C 05/05/24 08:37  Pulse 85 05/05/24 08:38  Resp 11 05/05/24 08:38  SpO2 91 % 05/05/24 08:38  Vitals shown include unfiled device data.  Last Pain:  Vitals:   05/05/24 0552  TempSrc: Oral  PainSc: 0-No pain      Patients Stated Pain Goal: 7 (05/05/24 0552)  Complications: No notable events documented.

## 2024-05-05 NOTE — Discharge Instructions (Addendum)
 You can remove the catheter at home in the morning but if you don't feel you can do that, please call the office to set up a time for removal.         Post Anesthesia Home Care Instructions  Activity: Get plenty of rest for the remainder of the day. A responsible individual must stay with you for 24 hours following the procedure.  For the next 24 hours, DO NOT: -Drive a car -Advertising copywriter -Drink alcoholic beverages -Take any medication unless instructed by your physician -Make any legal decisions or sign important papers.  Meals: Start with liquid foods such as gelatin or soup. Progress to regular foods as tolerated. Avoid greasy, spicy, heavy foods. If nausea and/or vomiting occur, drink only clear liquids until the nausea and/or vomiting subsides. Call your physician if vomiting continues.  Special Instructions/Symptoms: Your throat may feel dry or sore from the anesthesia or the breathing tube placed in your throat during surgery. If this causes discomfort, gargle with warm salt water . The discomfort should disappear within 24 hours.

## 2024-05-05 NOTE — Anesthesia Postprocedure Evaluation (Signed)
 Anesthesia Post Note  Patient: Oscar Church  Procedure(s) Performed: TURBT (TRANSURETHRAL RESECTION OF BLADDER TUMOR) (Bladder) CYSTOSCOPY, WITH RETROGRADE PYELOGRAM (Bilateral: Bladder)     Patient location during evaluation: PACU Anesthesia Type: General Level of consciousness: awake and alert and oriented Pain management: pain level controlled Vital Signs Assessment: post-procedure vital signs reviewed and stable Respiratory status: spontaneous breathing, nonlabored ventilation and respiratory function stable Cardiovascular status: blood pressure returned to baseline and stable Postop Assessment: no apparent nausea or vomiting Anesthetic complications: no   No notable events documented.  Last Vitals:  Vitals:   05/05/24 0900 05/05/24 0915  BP: 118/79 135/89  Pulse: 81 80  Resp: 11 14  Temp:    SpO2: 96% 91%    Last Pain:  Vitals:   05/05/24 0915  TempSrc:   PainSc: 0-No pain                 Acelynn Dejonge A.

## 2024-05-05 NOTE — Op Note (Signed)
 Procedure: 1.  Cystoscopy with bilateral retrograde pyelograms and interpretation. 2.  Transurethral resection of 1 cm left bladder base lesion and 1.5 cm right bladder base lesion. 3.  Application fluoroscopy.  Preop diagnosis: History of bladder cancer with bladder wall lesions.  Postop diagnosis: Same.  Surgeon: Dr. Norleen Seltzer.  Anesthesia: General.  Specimen: TUR biopsy chips from the left bladder base and right bladder base.  Drain: 18 [place] Foley catheter.  EBL: None.  Complications: None.  Indications: The patient is a 63 year old male with history of bladder cancer with prior BCG was found on recent surveillance cystoscopy to have lesions on the left and right base of the bladder that were concerning for possibly recurrent carcinoma in situ versus treatment effect but it was felt the biopsies were indicated.  Procedure: He was taken operating room was given antibiotic.  A general anesthetic was induced.  He was placed in lithotomy position and fitted with PAS hose.  His perineum and genitalia were prepped with Betadine solution was draped in usual sterile fashion.  Cystoscopy was performed using the 21 [place] scope and 30 degree lens.  Examination revealed a normal urethra.  The external sphincter is intact.  The prostatic urethra was approximately 2 cm in length with trilobar hyperplasia with a small middle lobe.  Examination of bladder revealed mild trabeculation with some stellate scars on the posterior wall and erythema of the mucosa lateral to the ureteral orifices bilaterally with some shaggy exudative changes on the left.  No papillary tumors were seen.  A left retrograde pyelogram was performed with a 5 [place] open-ended catheter and Omnipaque .  Left retrograde pyelogram demonstrated normal ureter and intrarenal collecting system.  The right retrograde pyelogram was performed with the 5 [place] open-ended catheter and Omnipaque .  Left retrograde pyelogram demonstrated  normal ureter and intrarenal collecting system.  The urethra was then calibrated to 32 [place] with [employer] and a 26 French continuous-flow resectoscope sheath was inserted.  This was fitted with an Faustine handle with a bipolar loop and a 30 degree lens.  Saline was used the irrigant.  The left posterior bladder wall lesion was resected and the specimen was retrieved.  The base of the resection and the sounding mucosa was fulgurated to ensure hemostasis and destruction of the abnormal mucosa.  The right posterior posterior bladder wall lesion was then resected in a similar fashion and the specimen was retrieved.  The base of the resection surrounding mucosa was fulgurated to achieve hemostasis and destroyed abnormal mucosa.  Once hemostasis was secured and the chips were removed, inspection demonstrated a little fat at the base of the right resection with no significant perforation.  The scope was removed and an 65 French Foley catheter was inserted.  The balloon was filled with 10 mL of sterile fluid and the cath was placed to straight drainage.  He was taken down from lithotomy position, his anesthetic was reversed and he was moved recovery in stable condition.  There were no complications.

## 2024-05-05 NOTE — Interval H&P Note (Signed)
 History and Physical Interval Note:  05/05/2024 7:37 AM  Oscar Church  has presented today for surgery, with the diagnosis of BLADDER LESION WITH HISTORY OF BLADDER CANCER.  The various methods of treatment have been discussed with the patient and family. After consideration of risks, benefits and other options for treatment, the patient has consented to  Procedure(s): TURBT (TRANSURETHRAL RESECTION OF BLADDER TUMOR) (N/A) CYSTOSCOPY, WITH RETROGRADE PYELOGRAM (Bilateral) as a surgical intervention.  The patient's history has been reviewed, patient examined, no change in status, stable for surgery.  I have reviewed the patient's chart and labs.  Questions were answered to the patient's satisfaction.     [name]

## 2024-05-06 ENCOUNTER — Encounter (HOSPITAL_COMMUNITY): Payer: Self-pay | Admitting: Urology

## 2024-05-06 LAB — SURGICAL PATHOLOGY

## 2024-05-27 ENCOUNTER — Other Ambulatory Visit (HOSPITAL_BASED_OUTPATIENT_CLINIC_OR_DEPARTMENT_OTHER): Payer: Self-pay | Admitting: Family Medicine

## 2024-05-27 DIAGNOSIS — E7800 Pure hypercholesterolemia, unspecified: Secondary | ICD-10-CM

## 2024-06-17 ENCOUNTER — Ambulatory Visit (HOSPITAL_BASED_OUTPATIENT_CLINIC_OR_DEPARTMENT_OTHER)
Admission: RE | Admit: 2024-06-17 | Discharge: 2024-06-17 | Disposition: A | Discharge status: Home / Self Care | Payer: Self-pay | Source: Ambulatory Visit | Attending: Family Medicine | Admitting: Family Medicine

## 2024-06-17 DIAGNOSIS — E7800 Pure hypercholesterolemia, unspecified: Secondary | ICD-10-CM | POA: Insufficient documentation
